# Patient Record
Sex: Male | Born: 1970 | Hispanic: Yes | Marital: Single | Smoking: Never smoker
Health system: Southern US, Community
[De-identification: ages and names within clinical notes are randomized; demographics above are authoritative.]

## PROBLEM LIST (undated history)

## (undated) DIAGNOSIS — I471 Supraventricular tachycardia, unspecified: Secondary | ICD-10-CM

## (undated) HISTORY — PX: ABLATION: SHX5711

## (undated) HISTORY — PX: CHOLECYSTECTOMY: SHX55

---

## 2005-07-10 ENCOUNTER — Other Ambulatory Visit: Payer: Self-pay

## 2005-07-10 ENCOUNTER — Emergency Department: Payer: Self-pay | Admitting: General Practice

## 2005-07-11 ENCOUNTER — Ambulatory Visit: Payer: Self-pay | Admitting: General Practice

## 2005-07-25 ENCOUNTER — Ambulatory Visit: Payer: Self-pay | Admitting: Family Medicine

## 2006-07-12 ENCOUNTER — Other Ambulatory Visit: Payer: Self-pay

## 2006-07-12 ENCOUNTER — Emergency Department: Payer: Self-pay | Admitting: Emergency Medicine

## 2006-08-27 ENCOUNTER — Ambulatory Visit: Payer: Self-pay | Admitting: Cardiology

## 2007-04-22 ENCOUNTER — Ambulatory Visit: Payer: Self-pay | Admitting: Cardiology

## 2007-07-15 ENCOUNTER — Ambulatory Visit: Payer: Self-pay | Admitting: General Practice

## 2007-08-12 ENCOUNTER — Ambulatory Visit: Payer: Self-pay | Admitting: General Practice

## 2008-07-12 ENCOUNTER — Emergency Department: Payer: Self-pay | Admitting: Emergency Medicine

## 2008-07-13 ENCOUNTER — Emergency Department: Payer: Self-pay | Admitting: Emergency Medicine

## 2008-07-18 ENCOUNTER — Ambulatory Visit: Payer: Self-pay | Admitting: Family Medicine

## 2008-09-28 ENCOUNTER — Ambulatory Visit: Payer: Self-pay

## 2008-10-06 ENCOUNTER — Ambulatory Visit: Payer: Self-pay | Admitting: Unknown Physician Specialty

## 2008-10-11 ENCOUNTER — Ambulatory Visit: Payer: Self-pay | Admitting: Unknown Physician Specialty

## 2009-01-18 ENCOUNTER — Other Ambulatory Visit: Payer: Self-pay

## 2010-04-06 ENCOUNTER — Ambulatory Visit: Payer: Self-pay | Admitting: General Practice

## 2013-01-26 ENCOUNTER — Observation Stay: Payer: Self-pay | Admitting: Surgery

## 2013-01-26 ENCOUNTER — Ambulatory Visit: Payer: Self-pay | Admitting: Family Medicine

## 2013-01-26 LAB — COMPREHENSIVE METABOLIC PANEL
Albumin: 3.8 g/dL (ref 3.4–5.0)
Alkaline Phosphatase: 98 U/L (ref 50–136)
Anion Gap: 8 (ref 7–16)
BUN: 4 mg/dL — ABNORMAL LOW (ref 7–18)
Bilirubin,Total: 0.8 mg/dL (ref 0.2–1.0)
Calcium, Total: 9.4 mg/dL (ref 8.5–10.1)
Chloride: 100 mmol/L (ref 98–107)
Co2: 28 mmol/L (ref 21–32)
Creatinine: 0.84 mg/dL (ref 0.60–1.30)
EGFR (African American): >60
EGFR (Non-African Amer.): >60
Glucose: 94 mg/dL (ref 65–99)
Osmolality: 269 (ref 275–301)
Potassium: 3.9 mmol/L (ref 3.5–5.1)
SGOT(AST): 22 U/L (ref 15–37)
SGPT (ALT): 30 U/L (ref 12–78)
Sodium: 136 mmol/L (ref 136–145)
Total Protein: 7.8 g/dL (ref 6.4–8.2)

## 2013-01-26 LAB — CBC
HCT: 40.1 % (ref 40.0–52.0)
HGB: 14 g/dL (ref 13.0–18.0)
MCH: 32.5 pg (ref 26.0–34.0)
MCHC: 34.9 g/dL (ref 32.0–36.0)
MCV: 93 fL (ref 80–100)
Platelet: 217 10*3/uL (ref 150–440)
RBC: 4.31 10*6/uL — ABNORMAL LOW (ref 4.40–5.90)
RDW: 13.3 % (ref 11.5–14.5)
WBC: 10 10*3/uL (ref 3.8–10.6)

## 2013-01-26 LAB — LIPASE, BLOOD: Lipase: 81 U/L (ref 73–393)

## 2013-01-26 LAB — PROTIME-INR
INR: 1
Prothrombin Time: 13.5 secs (ref 11.5–14.7)

## 2013-01-28 LAB — PATHOLOGY REPORT

## 2013-02-04 ENCOUNTER — Ambulatory Visit: Payer: Self-pay | Admitting: Gastroenterology

## 2013-02-12 ENCOUNTER — Ambulatory Visit: Payer: Self-pay | Admitting: Gastroenterology

## 2013-03-23 ENCOUNTER — Ambulatory Visit: Payer: Self-pay | Admitting: Gastroenterology

## 2013-10-12 DIAGNOSIS — I471 Supraventricular tachycardia: Secondary | ICD-10-CM | POA: Insufficient documentation

## 2014-09-02 NOTE — Consult Note (Signed)
Surgical path reviewed. No obvious Crohn's at least on path. If clinically patient does not recover quickly, can order SB series to evaluate TI and cecal area. If patient recovers quickly, then patient can f/u in office after discharge. We can then arrange outpt colonoscopy in several weeks. Thanks  Electronic Signatures: Lutricia Feilh, Brenisha Tsui (MD)  (Signed on 18-Sep-14 13:19)  Authored  Last Updated: 18-Sep-14 13:19 by Lutricia Feilh, Eyan Hagood (MD)

## 2014-09-02 NOTE — Consult Note (Signed)
Brief Consult Note: Diagnosis: Acute appendicitis.  s/p appendectomy.  Abnormal findings on CT scan of abdomen and pelvis.  Inflammatory changes involving cecum.  Concerning for Crohn's disease.   Consult note dictated.   Discussed with Attending MD.   Comments: Patient's presentation discussed with Dr. Lutricia FeilPaul Oh.  Recommendation is to proceed forward with diagnostic colonoscopy to allow direct luminal evaluation of colon and obtain biopsies.  Date to be decided based on recovery period from recent appendectomy.  Recommendation to be performed on outpatient basis at this time. Will continue to monitor.  Electronic Signatures: Rodman KeyHarrison, Dawn S (NP)  (Signed 17-Sep-14 15:28)  Authored: Brief Consult Note   Last Updated: 17-Sep-14 15:28 by Rodman KeyHarrison, Dawn S (NP)

## 2014-09-02 NOTE — Consult Note (Signed)
Pt seen and examined. Please see Dawn Harrison's notes. Await surg path. If path c/w IBD, then will start 5-ASA product. Otherwise, will need outpt colonoscopy later. Will follow. Thanks  Electronic Signatures: Lutricia Feilh, Ayaana Biondo (MD)  (Signed on 17-Sep-14 19:13)  Authored  Last Updated: 17-Sep-14 19:13 by Lutricia Feilh, Kenniyah Sasaki (MD)

## 2014-09-02 NOTE — Consult Note (Signed)
PATIENT NAME:  Tracy Herring, Tracy Herring MR#:  [id number] DATE OF BIRTH:  1970/07/29  DATE OF CONSULTATION:  01/27/2013  ATTENDING PHYSICIAN:  Dr. Marshia Ly CONSULTING PHYSICIAN:  Lutricia Feil, MD/Joanthony Hamza Mort Sawyers, NP PRIMARY CARE PHYSICIAN: At [facility]  REASON FOR CONSULT: CT scan concerning for Crohn's disease.   HISTORY OF PRESENT ILLNESS: Tracy Herring is a 44 year old Hispanic gentleman who presented to [facility] Emergency Room on 01/26/2013 with a 4-day history of generalized abdominal pain, she states on last Friday that he started to experience generalized abdominal pain with increase in abdominal girth size, bloating and intestinal gas. He has a known history of having had Helicobacter pylori treated by Dr. Lynnae Prude at the time of EGD revealing this by biopsy approximately 3 to 4 years ago. He does note, though, that he was in [place] for 2 months in length, just came home 2 months ago, and during that timeframe he actually experienced diarrhea twice 4 days in length both times. He did not seek medical attention at that time. The patient states that over the weekend he had 1 episode of severe diarrhea which occurred approximately 48 hours prior to presenting to the Emergency Room. He denies any nausea. No vomiting. No rectal bleeding or melena. He was found to have a white count over 10,000. CT scan of abdomen and pelvis was performed which revealed evidence of a thickened appendix with some mild periappendiceal inflammatory changes and thickened cecum.  It was read as acute appendicitis and possible cecitis.   He states a long history of abdominal bloating, distention. He states that he has these episodes which occur on average once a month. He has changed his diet recently, eating more fruits vegetables. His bowels are moving on average 3 times a day. He does notice at times evidence of bright red blood on toilet paper which he feels is in correlation with hemorrhoids. This has  been going on for the past 5 years. No fevers. Known history of reflux which is well controlled, no dysphagia or odynophagia.  Prior to presenting, though, he did feel that he needed to be "cleansed" and drank prune juice with MiraLax which did result in good results on Monday.   HOME MEDICATIONS: None.   ALLERGIES: None.   PAST MEDICAL HISTORY: Tachycardia, status post cardiac ablation, reflux.   PAST SURGICAL HISTORY: Cardiac ablation and then appendectomy 01/26/2013.   FAMILY HISTORY: Great uncle maternal side of the family history of brain cancer, and maternal grandmother,  "intestinal cancer," felt to be probably colon.  Mother, history of colitis, questionable ulcerative colitis. No documented family history of Crohn's disease or celiac.   SOCIAL HISTORY: No tobacco, no alcohol use. 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   REVIEW OF SYSTEMS: All 10 systems reviewed and checked, otherwise unremarkable other than what is stated above.   PHYSICAL EXAMINATION: VITAL SIGNS: Temperature is 98.6, pulse is 72, respirations are 18, blood pressure is 105/66, and pulse ox is 96% on room air.  GENERAL: Well developed, well nourished 44 year old Hispanic gentleman, no acute distress noted which is not unreasonable status post surgery, some grimacing noted with movement.  HEENT: Normocephalic, atraumatic. Pupils equal, reactive to light. Conjunctivae clear. Sclerae anicteric.  NECK: Supple. Trachea midline. No lymphadenopathy or thyromegaly.  PULMONARY: Symmetric rise and fall of chest. Clear to auscultation throughout.  CARDIOVASCULAR: Regular rhythm, S1, S2. No murmurs, no gallops.  ABDOMEN: Slightly distended. Bowel sounds  are hypoactive. No masses. No bruits.  RECTAL: Deferred.  MUSCULOSKELETAL: Moving all 4 extremities. No contractures. No clubbing.  NEUROLOGICAL: No gross neurological deficits.  PSYCHIATRIC: Alert  and oriented x 4. Memory grossly intact. Appropriate affect and mood.   LABORATORY AND DIAGNOSTIC: Chemistry panel within normal limits except BUN is low at 4. Hepatic panel within normal limits. CBC: RBC is 4.31, otherwise within normal limits. Antibody screen is negative. ABO group plus Rh type is A+. PT is 13.1 and INR is 1.0. EKG is normal sinus rhythm. CT scan abdomen and pelvis with contrast: Again, findings consistent with acute appendicitis and an appearance of cecitis. Otherwise unremarkable.   IMPRESSION:  1.  Acute appendicitis, status post appendectomy.  2.  Findings  of inflammatory changes involving the cecum on CT scan of abdomen and pelvis. History of generalized abdominal discomfort intermittently. Findings concerning for possible Crohn's disease.  PLAN: The patient's presentation was discussed with Dr. Lutricia FeilPaul Oh. Do recommend endoscopic evaluation via diagnostic colonoscopy. Date to be decided depending on the patient's recovery from recent appendectomy. This was discussed with the patient. The patient voiced understanding and agreement. Feel will need to be done on an outpatient basis. We will continue to monitor the patient during hospitalization at this time.  No further GI recommendations. I will monitor laboratory studies as well.   These services provided by Rodman Keyawn S. Samanthan Dugo, MS, APRN, [facility], FNP,  under collaborative agreement with Lutricia FeilPaul Oh, MD.       ____________________________ Rodman Keyawn S. Quy Lotts, NP dsh:cb D: 01/27/2013 15:26:21 ET T: 01/27/2013 16:38:58 ET JOB#: [id number]  cc: Rodman Keyawn S. Mikhael Hendriks, NP, <Dictator> Rodman KeyAWN S Janus Vlcek MD ELECTRONICALLY SIGNED 01/27/2013 18:23

## 2014-09-02 NOTE — Discharge Summary (Signed)
PATIENT NAME:  Tracy Herring, Tracy Herring MR#:  [id number] DATE OF BIRTH:  1971-04-03  DATE OF ADMISSION:  01/26/2013 DATE OF DISCHARGE:  01/28/2013.   BRIEF HISTORY: Tracy Herring is a 44 year old gentleman admitted through the Emergency Room with signs and symptoms consistent with acute appendicitis. CT scan, however, revealed some cecal thickening in addition which was concerning for inflammatory bowel disease or cecitis. Because of his inflamed appendix, he was taken urgently to surgery that evening where he underwent a laparoscopic appendectomy. His appendix was injected and swollen with an obvious fecalith at the base. Surgery was accomplished without difficulty. The cecum did appear to be quite firm without obvious evidence of Crohn's disease. He has done well over the course of the last 24 hours. He was able to tolerate a regular diet. He has been seen by the GI service who recommended followup with colonoscopy in several weeks. He is discharged home tonight to be followed in our office in 7 to 10 days' time.   He is discharged home on Vicodin for pain. He is also discharged home on metronidazole 500 mg every 8 hours for 7 days and ciprofloxacin 500 mg every 12 hours for 7 days.   FINAL DISCHARGE DIAGNOSIS: Acute appendicitis, possible inflammatory bowel disease.   SURGERY: Laparoscopic appendectomy.   ____________________________ Quentin Orealph L. Ely III, MD rle:gb D: 01/28/2013 20:36:14 ET T: 01/28/2013 20:45:29 ET JOB#: [id number]  cc: Carmie Endalph L. Ely III, MD, <Dictator> Rhona LeavensJames F. Burnett ShengHedrick, MD Ezzard StandingPaul Y. Bluford Kaufmannh, MD Quentin OreALPH L ELY MD ELECTRONICALLY SIGNED 02/01/2013 20:04

## 2014-09-02 NOTE — Op Note (Signed)
PATIENT NAME:  Tracy Herring, Anvay MR#:  [id number] DATE OF BIRTH:  1971-04-27  DATE OF PROCEDURE:  01/26/2013  PREOPERATIVE DIAGNOSIS:  Acute appendicitis.   POSTOPERATIVE DIAGNOSIS:  Acute appendicitis, possible Crohn's disease.   SURGERY:  Laparoscopic appendectomy.   SURGEON:  Quentin Orealph L. Ely, M.D.   ANESTHESIA:  General.   OPERATIVE PROCEDURE:  With the patient in the supine position after induction of appropriate general anesthesia, the patient's abdomen was prepped with ChloraPrep and draped with sterile towels.  The patient placed in head down, feet up position.  A small infraumbilical incision was made in the standard fashion, carried down bluntly through the subcutaneous tissue.  A Veress needle was used to cannulate the peritoneal cavity.  CO2 was insufflated to appropriate pressure measurements.  When approximately two and a half liters of CO2 were instilled, the Veress needle was withdrawn.  An 11 mm [employer] port was inserted into the peritoneal cavity and appropriate position was confirmed.  CO2 was reinsufflated.  The right lower quadrant was investigated.  The appendix was easily visualized, appeared to be thickened, injected and inflamed.  However, there was not any evidence of any surrounding inflammatory change.  A midepigastric transverse incision was made and an 11 mm port was inserted under direct vision.  The camera was moved to the upper port.  Suprapubic port was placed, 12 mm in size under direct vision.  Dissection was carried at the base of the appendix.  The mesoappendix was foreshortened, but there did not appear to be any significant inflammatory change.  The cecum itself was quite thickened, but there was no creeping fat and no obvious cirrhosal change and no obvious inflammation.  There did appear to be a mass effect or thickened cecal wall, the etiology of which cannot be determined by laparoscopy.  I elected to perform an appendectomy as the patient's  appendix did not appear to be significantly involved.  My feeling with the clinical presentation and the visual inspection of the right lower quadrant is that of Crohn's disease rather than malignancy.  The mesoappendix was divided with a single application of the Endo GIA stapler carrying a white load and the base of the appendix was amputated at the cecum using a single application of Endo GIA stapler carrying a blue load.  The appendix was captured in an Endo Catch apparatus and removed through the suprapubic incision.  The area was copiously irrigated with warm saline solution for 2 liters.  The abdomen was then desufflated.  The lower midline and umbilical fascia were closed with 0 Vicryl suture in a figure-of-eight fashion.  The suprapubic incision was closed with the suture passer under direct vision.  Skin incisions were closed with 5-0 nylon.  The area infiltrated with 0.25% Marcaine for postoperative pain control.  Sterile dressings were applied.  The patient was awakened and returned to the recovery room in the company of the anesthesiologist.  Sponge, instrument and needle counts were correct x 2 in the operating room.    ____________________________ Carmie Endalph L. Ely III, MD rle:ea D: 01/26/2013 22:11:47 ET T: 01/27/2013 00:17:39 ET JOB#: [id number]  cc: Carmie Endalph L. Ely III, MD, <Dictator> Rhona LeavensJames F. Burnett ShengHedrick, MD Scot Junobert T. Elliott, MD Quentin OreALPH L ELY MD ELECTRONICALLY SIGNED 01/28/2013 0:06

## 2014-09-02 NOTE — H&P (Signed)
PATIENT NAME:  Tracy Herring, Tracy Herring MR#:  [id number] DATE OF BIRTH:  February 04, 1971  DATE OF ADMISSION:  01/26/2013  PRIMARY CARE PHYSICIAN:  [facility].   ADMITTING PHYSICIAN:  Dr. Michela Pitcher.   CHIEF COMPLAINT:  Abdominal pain.   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 He was in his usual state of good health until Friday September 12th when he began to develop some generalized abdominal discomfort. He has a history of GI distress, treated for increased problems with abdominal bloating and gas. Has had an upper endoscopy which did reveal some changes consistent with GERD but has never had a colonoscopy.  He thought the symptoms he experienced on the 12th were part of his normal problem. However, he began to become progressively uncomfortable over the weekend, had an episode of severe diarrhea 48 hours ago. With continued symptoms, he had some mild nausea, presented to his primary care physician. He had elevated white blood cell count just over 10,000. CT scan was performed with contrast which demonstrated a thickened appendix with some mild periappendiceal inflammatory changes and a thickened cecum read as acute appendicitis, possible cecitis.  The patient was referred to the Emergency Room and surgical service was consulted.   He never had a previous colonoscopy. He does not have any significant history of diarrhea but has had abdominal discomfort intermittently for years. He has not lost weight recently, although he has maintained a low BMI for a number of years. He has had no previous abdominal surgery. He has had a previous problem with tachycardia treated by Dr. Mariel Kansky, referred to [facility] for an ablation, which was performed 6 years ago. He is currently on no medication for that. He denies any hypertension, diabetic problems or thyroid  disease. He takes no other medications regularly. He is not allergic to any medications.   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   REVIEW OF SYSTEMS: Otherwise unremarkable. He has no urinary symptoms at the present time.   FAMILY HISTORY: Noncontributory.   PHYSICAL EXAMINATION: GENERAL: He seems comfortable at rest, although he has recently been medicated.  VITALS: Blood pressure 128/72. Temperature is 99. Pulse rate is 90 and regular. His pain scale is an 8.  HEENT:  Reveals no scleral icterus. No pupillary abnormalities. No facial deformities.  NECK: Supple, nontender with midline trachea. No adenopathy.  CHEST: Clear with no adventitious sounds. He has normal pulmonary excursion.  CARDIAC: Reveals no murmurs or gallops to my ear. He seems to be in normal sinus rhythm.  ABDOMEN: Soft. He does have marked point tenderness in the right lower quadrant suprapubic area with mild guarding. He has no rebound noted. He is hypoactive, but present bowel sounds.  EXTREMITIES: With full range of motion, no deformities.  PSYCHIATRIC: Normal orientation, normal affect.   IMPRESSION: I have independently reviewed his CT scan, as well as the CT scan report. I am a bit concerned about the degree of inflammatory change in the cecum which raises the question of possible Crohn disease as opposed to simple appendicitis. However, we could definitely have appendicitis with surrounding inflammatory changes involving the cecum. I think in view of his current symptoms and the CT findings, we should consider laparoscopy, possible appendectomy, possible laparotomy, possible colon resection based on the findings at surgery. This  plan has been outlined to him in detail, and he is in agreement.     ____________________________ Carmie Endalph L. Ely III, MD rle:dmm D: 01/26/2013 19:58:00 ET T: 01/26/2013 20:23:53 ET JOB#: [id number]  cc: Carmie Endalph L. Ely  III, MD, <Dictator> Rhona LeavensJames F. Burnett ShengHedrick, MD Quentin OreALPH L ELY MD ELECTRONICALLY SIGNED 01/28/2013 0:05

## 2014-10-06 IMAGING — CT CT ABD-PELV W/ CM
1 of 2 series · 15 of 32 positions shown, 19 images · IV contrast (isovue)
Comparison: None

REASON FOR EXAM: Call report [id number]    RLQ pain
COMMENTS:

PROCEDURE:     CT  - CT ABDOMEN / PELVIS  W  - January 26, 2013  [DATE]
RESULT:     History: Right lower quadrant pain
TECHNIQUE: Multiple axial images of the abdomen and pelvis were performed
from the lung bases to the pubic symphysis, with p.o. contrast and with 100
ml of Isovue 370 intravenous contrast.

[Series 2: soft tissue · axial · 0.66mm/px · z∈[-904,-475]mm · 15 of 157 slices shown, 19 images]
[im 7/157  soft-tissue]
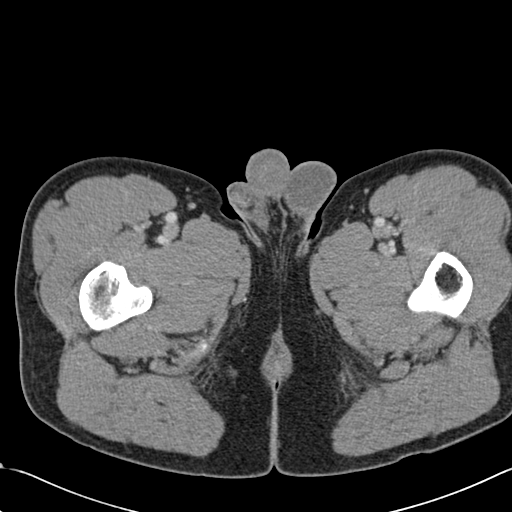
[im 7/157  bone]
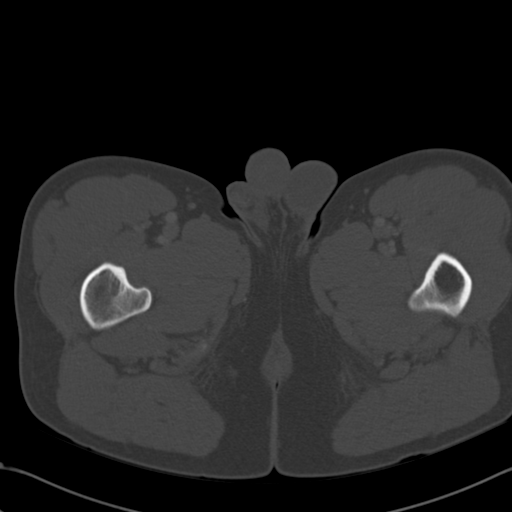
[im 20/157  soft-tissue]
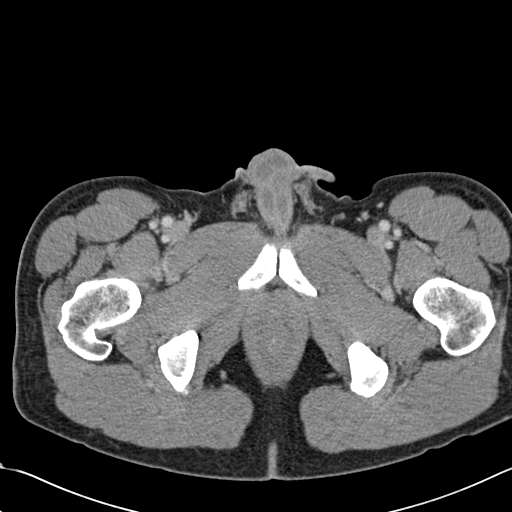
[im 33/157  soft-tissue]
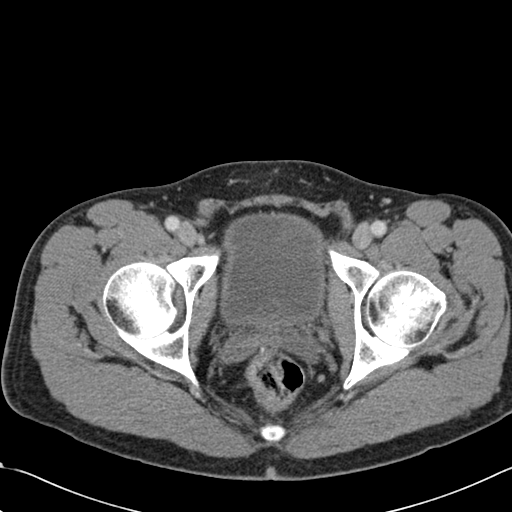
[im 46/157  soft-tissue]
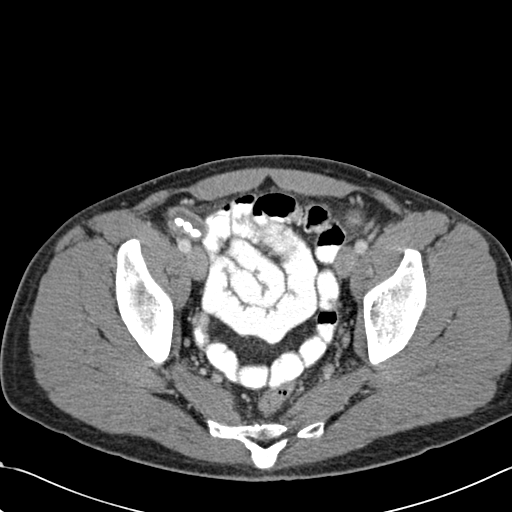
[im 53/157  soft-tissue]
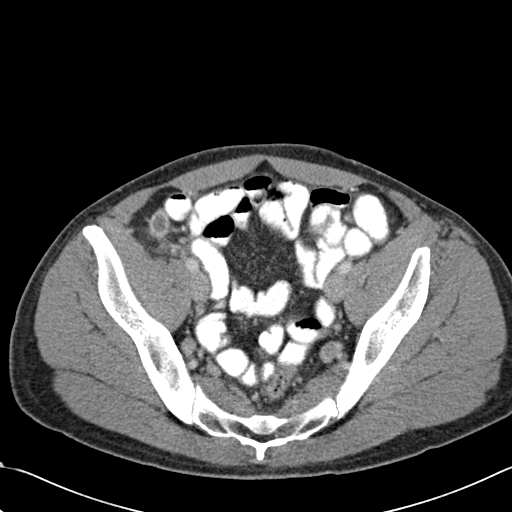
[im 66/157  soft-tissue]
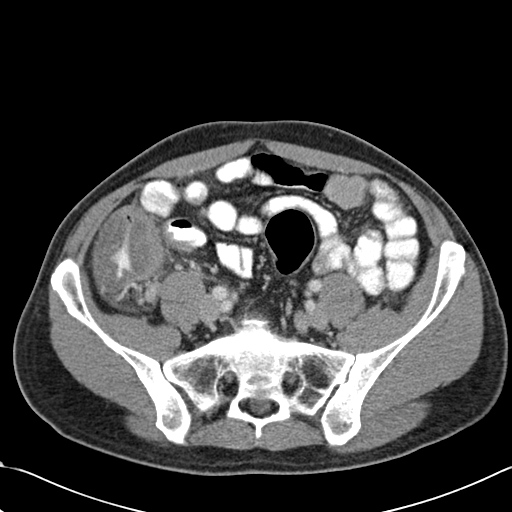
[im 79/157  soft-tissue]
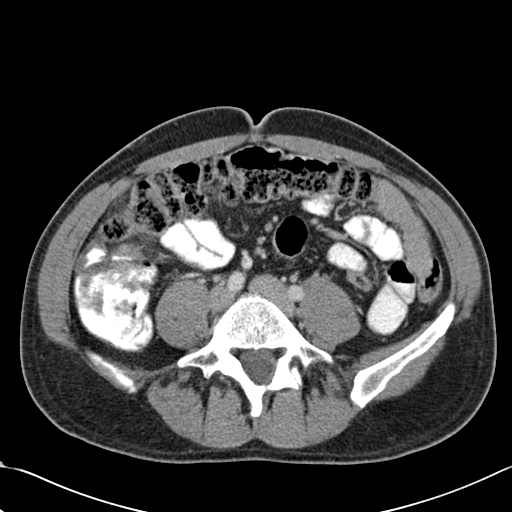
[im 92/157  soft-tissue]
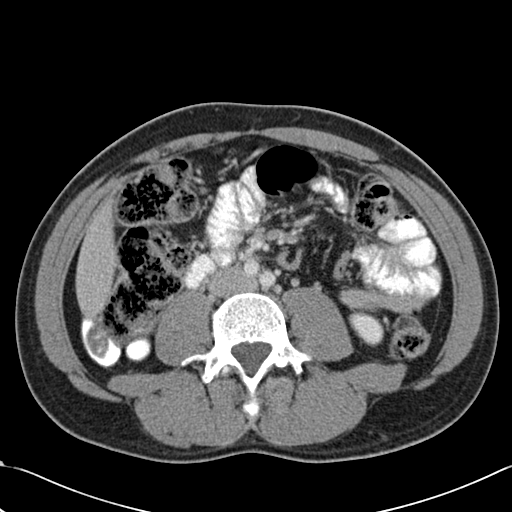
[im 105/157  soft-tissue]
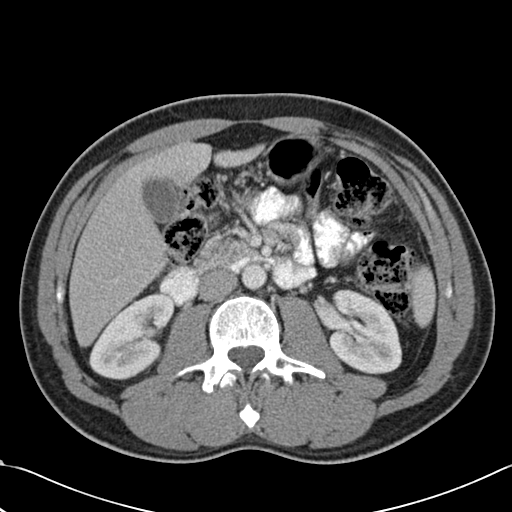
[im 105/157  bone]
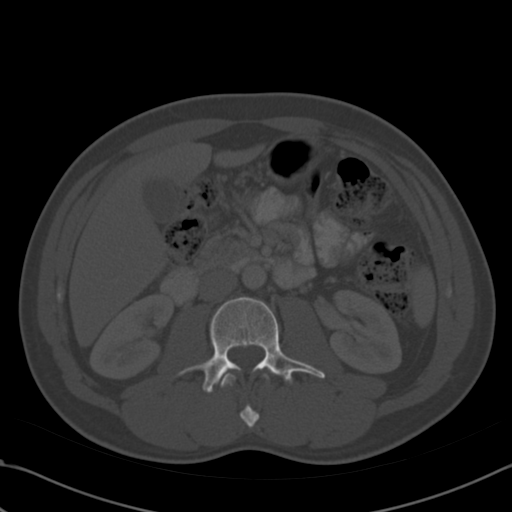
[im 111/157  soft-tissue]
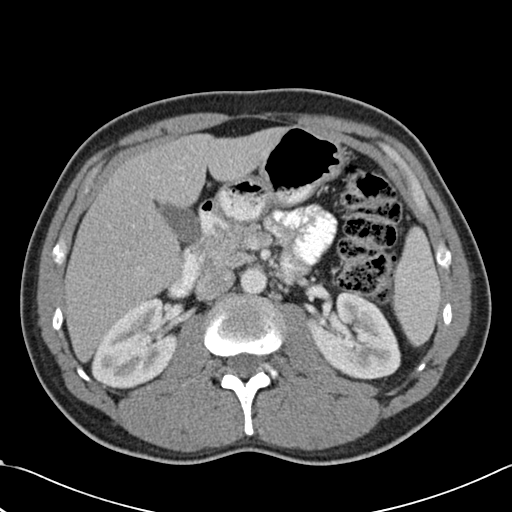
[im 124/157  soft-tissue]
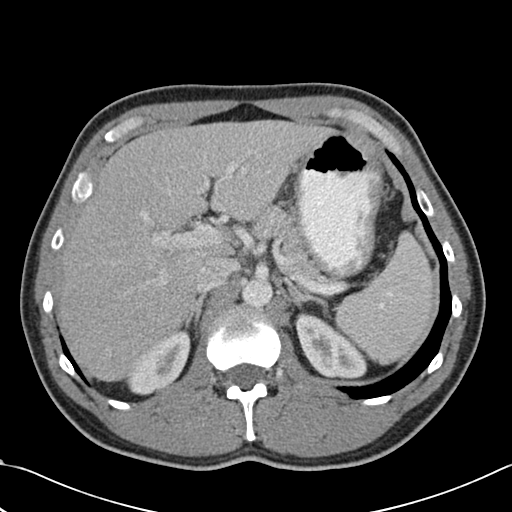
[im 131/157  lung]
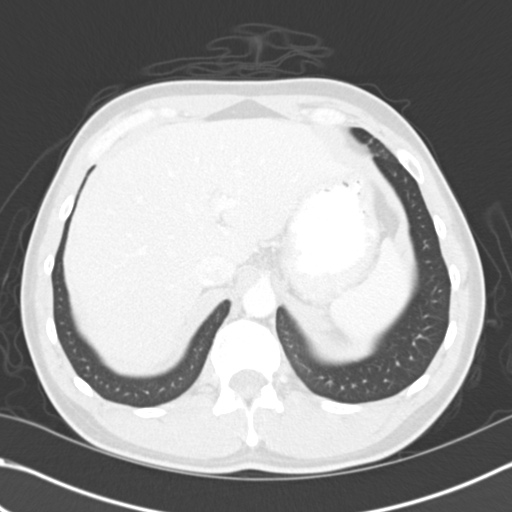
[im 137/157  soft-tissue]
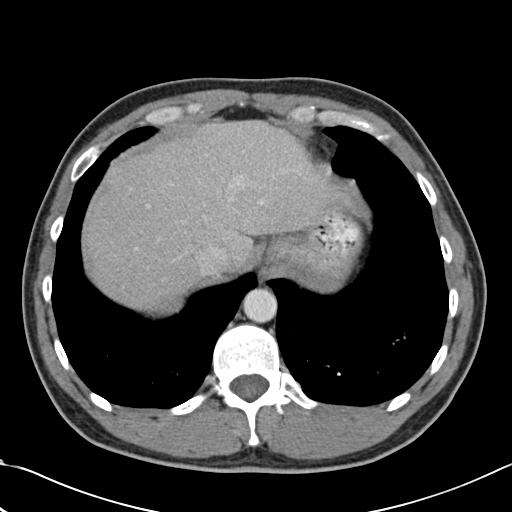
[im 137/157  lung]
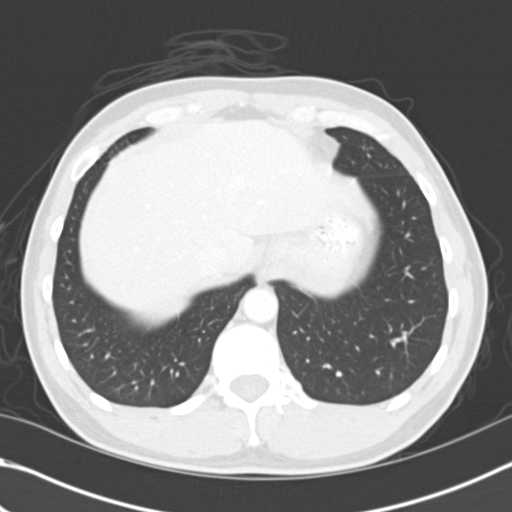
[im 144/157  lung]
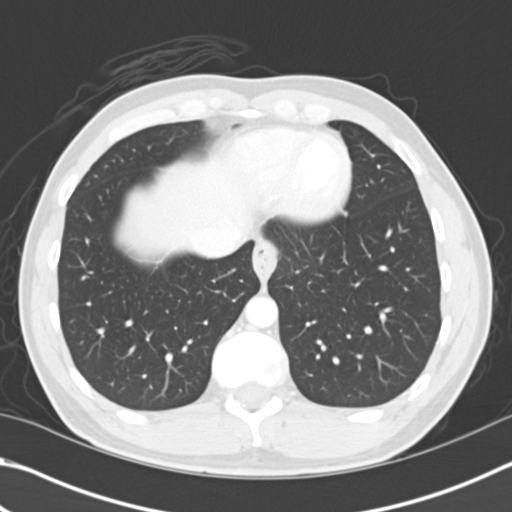
[im 150/157  soft-tissue]
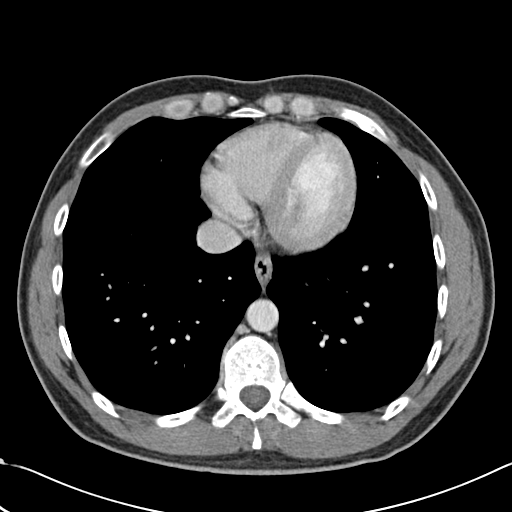
[im 150/157  lung]
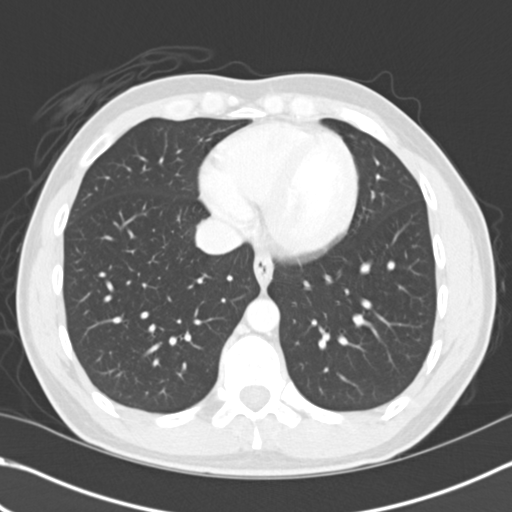

[15 of 32 positions shown; findings below may reference images not displayed]

FINDINGS: The lung bases are clear. There is no pneumothorax. The heart size is
normal.

The liver demonstrates no focal abnormality. There is no intrahepatic or
extrahepatic biliary ductal dilatation. The gallbladder is unremarkable. The
spleen demonstrates no focal abnormality. The kidneys, adrenal glands, and
pancreas are normal. The bladder is unremarkable.

The appendix is dilated measuring 14 mm with multiple appendicoliths present
and cecal bowel wall thickening. There is periappendiceal inflammatory
change. There is no focal fluid collection. The appearance is most
consistent with appendicitis and cecitis. There is no pneumoperitoneum,
pneumatosis, or portal venous gas. There is no abdominal or pelvic free
fluid. There is no lymphadenopathy.

The abdominal aorta is normal in caliber .

The osseous structures are unremarkable.
IMPRESSION: 1. Findings most consistent with acute appendicitis.

[REDACTED]

## 2015-07-28 ENCOUNTER — Ambulatory Visit: Payer: Self-pay | Admitting: Physician Assistant

## 2015-07-28 ENCOUNTER — Encounter: Payer: Self-pay | Admitting: Physician Assistant

## 2015-07-28 DIAGNOSIS — F4541 Pain disorder exclusively related to psychological factors: Secondary | ICD-10-CM

## 2015-07-28 NOTE — Progress Notes (Signed)
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

## 2015-09-18 ENCOUNTER — Ambulatory Visit: Payer: Self-pay | Admitting: Physician Assistant

## 2015-09-18 VITALS — BP 126/80 | HR 76 | Resp 16

## 2015-09-18 DIAGNOSIS — S29011A Strain of muscle and tendon of front wall of thorax, initial encounter: Secondary | ICD-10-CM

## 2015-09-19 ENCOUNTER — Encounter: Payer: Self-pay | Admitting: Physician Assistant

## 2015-09-19 NOTE — Progress Notes (Signed)
S/ reports chronic difficulty straining with defecation although stools are "soft"  , he felt a pulling sensation in lower sternal area one week ago with defecation strain , he is concerned about having a hernia . He has not taking any thing or used heat or ice. He denies wt loss, Pleuritic pain, SOB, anorexia,abd pain,nausea or vomiting, melena or BRB  O/ VSS NAD anxious Heart RSR without abn sounds Lungs clear Chest Wall nontender he points to lower sternal area xyphoid that was hurting , not reproducable now. Abd soft without megally, mass or tenderness , rebound with flexion of neck he has  I FB separation of rectus abdominal muscles without  herniation ,old  incisional scar above the umbelicus , intraumbelical from appendectomy  A/ chest wall pain  Resolved Constipation  P /advised to take otc aleve bid with meals x one week. Avoid abdominals at gym x until sx s abate. May use topical heat . RTC if sxs not improving. Constipation addressed  .

## 2015-09-25 ENCOUNTER — Ambulatory Visit
Admission: RE | Admit: 2015-09-25 | Discharge: 2015-09-25 | Disposition: A | Discharge status: Home / Self Care | Payer: BC Managed Care – PPO | Source: Ambulatory Visit | Attending: Physician Assistant | Admitting: Physician Assistant

## 2015-09-25 ENCOUNTER — Encounter: Payer: Self-pay | Admitting: Physician Assistant

## 2015-09-25 ENCOUNTER — Ambulatory Visit: Payer: Self-pay | Admitting: Physician Assistant

## 2015-09-25 VITALS — BP 110/70 | HR 68 | Temp 97.7°F

## 2015-09-25 DIAGNOSIS — R1010 Upper abdominal pain, unspecified: Secondary | ICD-10-CM | POA: Diagnosis present

## 2015-09-25 MED ORDER — OMEPRAZOLE 20 MG PO CPDR: 20.0000 mg | DELAYED_RELEASE_CAPSULE | Freq: Every day | ORAL | Status: DC

## 2015-09-25 NOTE — Patient Instructions (Signed)
Ventral Hernia A ventral hernia (also called an incisional hernia) is a hernia that occurs at the site of a previous surgical cut (incision) in the abdomen. The abdominal wall spans from your lower chest down to your pelvis. If the abdominal wall is weakened from a surgical incision, a hernia can occur. A hernia is a bulge of bowel or muscle tissue pushing out on the weakened part of the abdominal wall. Ventral hernias can get bigger from straining or lifting. Obese and older people are at higher risk for a ventral hernia. People who develop infections after surgery or require repeat incisions at the same site on the abdomen are also at increased risk. CAUSES  A ventral hernia occurs because of weakness in the abdominal wall at an incision site.  SYMPTOMS  Common symptoms include:  A visible bulge or lump on the abdominal wall.  Pain or tenderness around the lump.  Increased discomfort if you cough or make a sudden movement. If the hernia has blocked part of the intestine, a serious complication can occur (incarcerated or strangulated hernia). This can become a problem that requires emergency surgery because the blood flow to the blocked intestine may be cut off. Symptoms may include:  Feeling sick to your stomach (nauseous).  Throwing up (vomiting).  Stomach swelling (distention) or bloating.  Fever.  Rapid heartbeat. DIAGNOSIS  Your health care provider will take a medical history and perform a physical exam. Various tests may be ordered, such as:  Blood tests.  Urine tests.  Ultrasonography.  X-rays.  Computed tomography (CT). TREATMENT  Watchful waiting may be all that is needed for a smaller hernia that does not cause symptoms. Your health care provider may recommend the use of a supportive belt (truss) that helps to keep the abdominal wall intact. For larger hernias or those that cause pain, surgery to repair the hernia is usually recommended. If a hernia becomes  strangulated, emergency surgery needs to be done right away. HOME CARE INSTRUCTIONS  Avoid putting pressure or strain on the abdominal area.  Avoid heavy lifting.  Use good body positioning for physical tasks. Ask your health care provider about proper body positioning.  Use a supportive belt as directed by your health care provider.  Maintain a healthy weight.  Eat foods that are high in fiber, such as whole grains, fruits, and vegetables. Fiber helps prevent difficult bowel movements (constipation).  Drink enough fluids to keep your urine clear or pale yellow.  Follow up with your health care provider as directed. SEEK MEDICAL CARE IF:   Your hernia seems to be getting larger or more painful. SEEK IMMEDIATE MEDICAL CARE IF:   You have abdominal pain that is sudden and sharp.  Your pain becomes severe.  You have repeated vomiting.  You are sweating a lot.  You notice a rapid heartbeat.  You develop a fever. MAKE SURE YOU:   Understand these instructions.  Will watch your condition.  Will get help right away if you are not doing well or get worse.   This information is not intended to replace advice given to you by your health care provider. Make sure you discuss any questions you have with your health care provider.   Document Released: 04/15/2012 Document Revised: 05/20/2014 Document Reviewed: 04/15/2012 Elsevier Interactive Patient Education 2016 [employer] Hiatal Hernia A hiatal hernia occurs when part of your stomach slides above the muscle that separates your abdomen from your chest (diaphragm). You can be born with a hiatal hernia (  congenital), or it may develop over time. In almost all cases of hiatal hernia, only the top part of the stomach pushes through.  Many people have a hiatal hernia with no symptoms. The larger the hernia, the more likely that you will have symptoms. In some cases, a hiatal hernia allows stomach acid to flow back into the tube that  carries food from your mouth to your stomach (esophagus). This may cause heartburn symptoms. Severe heartburn symptoms may mean you have developed a condition called gastroesophageal reflux disease (GERD).  CAUSES  Hiatal hernias are caused by a weakness in the opening (hiatus) where your esophagus passes through your diaphragm to attach to the upper part of your stomach. You may be born with a weakness in your hiatus, or a weakness can develop. RISK FACTORS Older age is a major risk factor for a hiatal hernia. Anything that increases pressure on your diaphragm can also increase your risk of a hiatal hernia. This includes:  Pregnancy.  Excess weight.  Frequent constipation. SIGNS AND SYMPTOMS  People with a hiatal hernia often have no symptoms. If symptoms develop, they are almost always caused by GERD. They may include:  Heartburn.  Belching.  Indigestion.  Trouble swallowing.  Coughing or wheezing.  Sore throat.  Hoarseness.  Chest pain. DIAGNOSIS  A hiatal hernia is sometimes found during an exam for another problem. Your health care provider may suspect a hiatal hernia if you have symptoms of GERD. Tests may be done to diagnose GERD. These may include:  X-rays of your stomach or chest.  An upper gastrointestinal (GI) series. This is an X-ray exam of your GI tract involving the use of a chalky liquid that you swallow. The liquid shows up clearly on the X-ray.  Endoscopy. This is a procedure to look into your stomach using a thin, flexible tube that has a tiny camera and light on the end of it. TREATMENT  If you have no symptoms, you may not need treatment. If you have symptoms, treatment may include:  Dietary and lifestyle changes to help reduce GERD symptoms.  Medicines. These may include:  Over-the-counter antacids.  Medicines that make your stomach empty more quickly.  Medicines that block the production of stomach acid (H2 blockers).  Stronger medicines to  reduce stomach acid (proton pump inhibitors).  You may need surgery to repair the hernia if other treatments are not helping. HOME CARE INSTRUCTIONS   Take all medicines as directed by your health care provider.  Quit smoking, if you smoke.  Try to achieve and maintain a healthy body weight.  Eat frequent small meals instead of three large meals a day. This keeps your stomach from getting too full.  Eat slowly.  Do not lie down right after eating.  Do noteat 1-2 hours before bed.   Do not drink beverages with caffeine. These include cola, coffee, cocoa, and tea.  Do not drink alcohol.  Avoid foods that can make symptoms of GERD worse. These may include:  Fatty foods.  Citrus fruits.  Other foods and drinks that contain acid.  Avoid putting pressure on your belly. Anything that puts pressure on your belly increases the amount of acid that may be pushed up into your esophagus.   Avoid bending over, especially after eating.  Raise the head of your bed by putting blocks under the legs. This keeps your head and esophagus higher than your stomach.  Do not wear tight clothing around your chest or stomach.  Try not  to strain when having a bowel movement, when urinating, or when lifting heavy objects. SEEK MEDICAL CARE IF:  Your symptoms are not controlled with medicines or lifestyle changes.  You are having trouble swallowing.  You have coughing or wheezing that will not go away. SEEK IMMEDIATE MEDICAL CARE IF:  Your pain is getting worse.  Your pain spreads to your arms, neck, jaw, teeth, or back.  You have shortness of breath.  You sweat for no reason.  You feel sick to your stomach (nauseous) or vomit.  You vomit blood.  You have bright red blood in your stools.  You have black, tarry stools.    This information is not intended to replace advice given to you by your health care provider. Make sure you discuss any questions you have with your health  care provider.   Document Released: 07/20/2003 Document Revised: 05/20/2014 Document Reviewed: 04/16/2013 Elsevier Interactive Patient Education [employer]

## 2015-09-25 NOTE — Progress Notes (Signed)
S: c/o midepigastric pain, sometimes has heartburn but sx go away, pain in outer epigastric area is always there, increased with movement, bulges when he goes to sit up, no fever/chills/v/d  O: vitals wnl, nad, abd soft nontender, slight weakness at abdominal wall upon doing crunch, no bowel protrusion, bs normal all 4 quads, n/v intact  A: abdominal pain, ventral hernia  P: kub xray, prilosec for heartburn as needed, decrease size of weights while doing squats

## 2015-09-26 NOTE — Progress Notes (Signed)
Left message for patient to contact office to informed him that xray of abd was normal and to try  laxative

## 2016-03-14 DIAGNOSIS — B977 Papillomavirus as the cause of diseases classified elsewhere: Secondary | ICD-10-CM | POA: Insufficient documentation

## 2016-12-18 ENCOUNTER — Ambulatory Visit: Payer: Self-pay | Admitting: Physician Assistant

## 2016-12-19 NOTE — Progress Notes (Signed)
Late entry, epic down yesterday S: pt c/o sore throat on and off for 2 months, states his uvula has gotten really long, is snoring a lot, thinks its pnd but not sure, denies fever/chills  O: vitals wnl, nad, tms clear, throat red, uvula is extremely long lands on his tongue, neck supple no lymph, lungs c t a, cv rrr  A: sore throat  P: amoxil, refer to ENT

## 2016-12-26 NOTE — Progress Notes (Signed)
Faxed over referral request,demographic and progress note to [facility] attn: Wilkie AyeKristy

## 2016-12-31 NOTE — Progress Notes (Addendum)
Received fax from [facility] ENT appointment scheduled on 01/10/2017 @ 9:45 with Dr. Willeen Cass [address]. Attempted to reach out patient via phone unable to leave message. Per patient request Wanted a later time which was not available until 01/30/17 @ 3:30  Patient ok'd the date and time

## 2018-09-24 LAB — HM HIV SCREENING LAB: HM HIV Screening: NEGATIVE

## 2019-01-20 ENCOUNTER — Ambulatory Visit: Payer: Self-pay

## 2019-01-27 ENCOUNTER — Telehealth: Payer: Self-pay

## 2019-01-27 NOTE — Telephone Encounter (Signed)
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  Had rapid testing at [facility] on 01/20/19- Trep reactive and confirmatory negative.   Per Doren Custard [place] hence why his appt is not until 02/05/19. Aileen Fass, RN

## 2019-02-04 DIAGNOSIS — B977 Papillomavirus as the cause of diseases classified elsewhere: Secondary | ICD-10-CM

## 2019-02-05 ENCOUNTER — Other Ambulatory Visit: Payer: Self-pay

## 2019-02-05 ENCOUNTER — Ambulatory Visit: Payer: BC Managed Care – PPO | Admitting: Physician Assistant

## 2019-02-05 DIAGNOSIS — Z113 Encounter for screening for infections with a predominantly sexual mode of transmission: Secondary | ICD-10-CM

## 2019-02-05 DIAGNOSIS — Z202 Contact with and (suspected) exposure to infections with a predominantly sexual mode of transmission: Secondary | ICD-10-CM

## 2019-02-05 MED ORDER — PENICILLIN G BENZATHINE 1200000 UNIT/2ML IM SUSP
2.4000 10*6.[IU] | Freq: Once | INTRAMUSCULAR | Status: AC
  Administered 2019-02-05: 18:00:00 2.4 10*6.[IU] via INTRAMUSCULAR

## 2019-02-06 ENCOUNTER — Encounter: Payer: Self-pay | Admitting: Physician Assistant

## 2019-02-06 NOTE — Progress Notes (Signed)
    STI clinic/screening visit  Subjective:  Tracy Herring is a 48 y.o. male being seen today for an STI screening visit. The patient reports they do not have symptoms.  Patient has the following medical conditions:   Patient Active Problem List   Diagnosis Date Noted  . 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 SEXUALLY TRANSMITTED DISEASE    HPI  Patient reports that he was sent in by DIS as a contact to Syphilis.  Patient denies any symptoms and states that he has had a negative test earlier this month.  Agrees to testing and treatment today.  See flowsheet for further details and programmatic requirements.    The following portions of the patient's history were reviewed and updated as appropriate: allergies, current medications, past medical history, past social history, past surgical history and problem list.  Objective:  There were no vitals filed for this visit.  Physical Exam Constitutional:      General: He is not in acute distress.    Appearance: Normal appearance.  HENT:     Head: Normocephalic and atraumatic.     Mouth/Throat:     Mouth: Mucous membranes are moist.     Pharynx: Oropharynx is clear. No oropharyngeal exudate or posterior oropharyngeal erythema.  Eyes:     Conjunctiva/sclera: Conjunctivae normal.  Neck:     Musculoskeletal: Neck supple.  Pulmonary:     Effort: Pulmonary effort is normal.  Abdominal:     Palpations: Abdomen is soft. There is no mass.     Tenderness: There is no abdominal tenderness. There is no guarding or rebound.  Genitourinary:    Penis: Normal.      Scrotum/Testes: Normal.     Rectum: Normal.     Comments: Pubic area without nits, lice, edema, erythema, lesions or inguinal adenopathy. Penis circumcised without discharge at meatus. Lymphadenopathy:     Cervical: No cervical adenopathy.  Skin:    General: Skin is warm and dry.     Findings: No bruising,  erythema, lesion or rash.  Neurological:     Mental Status: He is alert and oriented to person, place, and time.  Psychiatric:        Mood and Affect: Mood normal.        Behavior: Behavior normal.        Thought Content: Thought content normal.       Assessment and Plan:  Tracy Herring is a 48 y.o. male presenting to the [facility] for STI screening  1. Screening for STD (sexually transmitted disease) Patient without symptoms today. Rec condoms with all sex. Await test results.  Counseled that RN or DIS will contact him if he needs further treatment or follow up once results are back. - [facility] - HIV  LAB - Syphilis Serology,  Lab  2. Venereal disease contact Will treat as a contact to Syphilis with Bicillin 2.4 mu IM today. No sex for 14 days and until after partner completes treatment. Patient observed for 15 minutes and released without problems due to patient having to return to work.  - penicillin g benzathine (BICILLIN LA) 1200000 UNIT/2ML injection 2.4 Million Units     No follow-ups on file.  No future appointments.  Jerene Dilling, PA

## 2019-10-06 ENCOUNTER — Encounter: Payer: Self-pay | Admitting: Physician Assistant

## 2019-10-06 ENCOUNTER — Other Ambulatory Visit: Payer: Self-pay

## 2019-10-06 ENCOUNTER — Ambulatory Visit: Payer: BC Managed Care – PPO | Admitting: Physician Assistant

## 2019-10-06 DIAGNOSIS — Z202 Contact with and (suspected) exposure to infections with a predominantly sexual mode of transmission: Secondary | ICD-10-CM

## 2019-10-06 DIAGNOSIS — Z113 Encounter for screening for infections with a predominantly sexual mode of transmission: Secondary | ICD-10-CM

## 2019-10-06 MED ORDER — AZITHROMYCIN 500 MG PO TABS
1000.0000 mg | ORAL_TABLET | Freq: Once | ORAL | Status: AC
  Administered 2019-10-06: 1000 mg via ORAL

## 2019-10-06 NOTE — Progress Notes (Signed)
Pt here for STD screening and reports he is a contact to Chlamydia.Lyman Speller, RN

## 2019-10-08 NOTE — Progress Notes (Signed)
   [facility] Department STI clinic/screening visit  Subjective:  Tracy Herring is a 49 y.o. male being seen today for an STI screening visit. The patient reports they do not have symptoms.    Patient has the following medical conditions:   Patient Active Problem List   Diagnosis Date Noted  . 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 SEXUALLY TRANSMITTED DISEASE    Chlamydia contact    HPI  Patient reports that he does not have any symptoms but is a contact to Chlamydia.  Declines blood work and screening exam today due to time constraints and requests treatment only.   See flowsheet for further details and programmatic requirements.    The following portions of the patient's history were reviewed and updated as appropriate: allergies, current medications, past medical history, past social history, past surgical history and problem list.  Objective:  There were no vitals filed for this visit.  Physical Exam Constitutional:      General: He is not in acute distress.    Appearance: Normal appearance.  HENT:     Head: Normocephalic and atraumatic.  Eyes:     Conjunctiva/sclera: Conjunctivae normal.  Pulmonary:     Effort: Pulmonary effort is normal.  Neurological:     Mental Status: He is alert and oriented to person, place, and time.  Psychiatric:        Mood and Affect: Mood normal.        Behavior: Behavior normal.        Thought Content: Thought content normal.        Judgment: Judgment normal.       Assessment and Plan:  Tracy Herring is a 49 y.o. male presenting to the [facility] Department for STI screening  1. Screening for STD (sexually transmitted disease) Patient into clinic without symptoms. Rec condoms with all sex. Patient declines screening exam and blood work today due to time constraints.  2. 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 No sex for 7 days and until after partner completes treatment.  RTC for re-treatment if vomits < 2 hr after taking medicine. - azithromycin (ZITHROMAX) tablet 1,000 mg     No follow-ups on file.  No future appointments.  Matt Holmes, PA

## 2020-04-25 ENCOUNTER — Telehealth: Payer: Self-pay | Admitting: Oncology

## 2020-04-25 NOTE — Telephone Encounter (Signed)
Re: Mab infusion  Call patient to discuss monoclonal antibody infusion.  Unfortunately he is beyond 10 days of onset of his symptoms.  Symptom onset was 04/15/2020.  Patient appears to be feeling much better.  Durenda Hurt, NP 04/25/2020 1:06 PM

## 2020-05-10 ENCOUNTER — Emergency Department
Admission: EM | Admit: 2020-05-10 | Discharge: 2020-05-11 | Disposition: A | Discharge status: Home / Self Care | Payer: BC Managed Care – PPO | Attending: Emergency Medicine | Admitting: Emergency Medicine

## 2020-05-10 ENCOUNTER — Emergency Department: Payer: BC Managed Care – PPO

## 2020-05-10 DIAGNOSIS — U071 COVID-19: Secondary | ICD-10-CM | POA: Diagnosis not present

## 2020-05-10 DIAGNOSIS — R0781 Pleurodynia: Secondary | ICD-10-CM | POA: Diagnosis present

## 2020-05-10 LAB — COMPREHENSIVE METABOLIC PANEL
ALT: 45 U/L — ABNORMAL HIGH (ref 0–44)
AST: 23 U/L (ref 15–41)
Albumin: 3.8 g/dL (ref 3.5–5.0)
Alkaline Phosphatase: 87 U/L (ref 38–126)
Anion gap: 9 (ref 5–15)
BUN: 7 mg/dL (ref 6–20)
CO2: 25 mmol/L (ref 22–32)
Calcium: 9.2 mg/dL (ref 8.9–10.3)
Chloride: 106 mmol/L (ref 98–111)
Creatinine, Ser: 0.84 mg/dL (ref 0.61–1.24)
GFR, Estimated: >60 mL/min (ref >60)
Glucose, Bld: 94 mg/dL (ref 70–99)
Potassium: 4.3 mmol/L (ref 3.5–5.1)
Sodium: 140 mmol/L (ref 135–145)
Total Bilirubin: 0.6 mg/dL (ref 0.3–1.2)
Total Protein: 7.8 g/dL (ref 6.5–8.1)

## 2020-05-10 LAB — CBC WITH DIFFERENTIAL/PLATELET
Abs Immature Granulocytes: 0.02 10*3/uL (ref 0.00–0.07)
Basophils Absolute: 0.1 10*3/uL (ref 0.0–0.1)
Basophils Relative: 1 %
Eosinophils Absolute: 0.2 10*3/uL (ref 0.0–0.5)
Eosinophils Relative: 4 %
HCT: 38.7 % — ABNORMAL LOW (ref 39.0–52.0)
Hemoglobin: 12.9 g/dL — ABNORMAL LOW (ref 13.0–17.0)
Immature Granulocytes: 0 %
Lymphocytes Relative: 42 %
Lymphs Abs: 2.5 10*3/uL (ref 0.7–4.0)
MCH: 32.2 pg (ref 26.0–34.0)
MCHC: 33.3 g/dL (ref 30.0–36.0)
MCV: 96.5 fL (ref 80.0–100.0)
Monocytes Absolute: 0.4 10*3/uL (ref 0.1–1.0)
Monocytes Relative: 7 %
Neutro Abs: 2.7 10*3/uL (ref 1.7–7.7)
Neutrophils Relative %: 46 %
Platelets: 334 10*3/uL (ref 150–400)
RBC: 4.01 MIL/uL — ABNORMAL LOW (ref 4.22–5.81)
RDW: 12.4 % (ref 11.5–15.5)
WBC: 5.9 10*3/uL (ref 4.0–10.5)
nRBC: 0 % (ref 0.0–0.2)

## 2020-05-10 LAB — TROPONIN I (HIGH SENSITIVITY): Troponin I (High Sensitivity): 3 ng/L (ref <18)

## 2020-05-10 MED ORDER — PREDNISONE 20 MG PO TABS
60.0000 mg | ORAL_TABLET | Freq: Once | ORAL | Status: AC
  Administered 2020-05-11: 60 mg via ORAL
  Filled 2020-05-10: qty 3

## 2020-05-10 MED ORDER — IOHEXOL 350 MG/ML SOLN
75.0000 mL | Freq: Once | INTRAVENOUS | Status: AC | PRN
  Administered 2020-05-10: 75 mL via INTRAVENOUS
  Filled 2020-05-10: qty 75

## 2020-05-10 NOTE — ED Triage Notes (Signed)
Pt presents to ER with c/o chest pain with inspiration, L & R side back, cough, and headache.  Pt states he had COVID back on 12/4 and has been having these symptoms since then.  Pt A&O x4 in triage.  Pt in NAD at this time.

## 2020-05-10 NOTE — ED Provider Notes (Signed)
[facility] Emergency Department Provider Note  ____________________________________________  Time seen: Approximately 10:05 PM  I have reviewed the triage vital signs and the nursing notes.   HISTORY  Chief Complaint Chest Pain    HPI Tracy Herring is a 49 y.o. male who presents the emergency department complaining of pleuritic chest pain x3 weeks.  Patient states that he was diagnosed with Covid approximately a month ago.  He seemed to improve from the Covid symptoms, though he does have a slight light cough remaining.  Patient states that he is developed a pleuritic type chest pain in the posterior chest over the past 2 to 3 weeks.  Patient states that it is constant, worse with inspiration.  There is no shortness of breath associated.  Patient denies this being a substernal type pain.  No radiation into the arms or jaw.  No cardiac history.  No shortness of breath.   Patient has no fevers or chills, URI symptoms, abdominal complaints at this time.  No medications for this complaint prior to arrival.        No past medical history on file.  Patient Active Problem List   Diagnosis Date Noted  . 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 Authorizing Provider  benzonatate (TESSALON PERLES) 100 MG capsule Take 1 capsule (100 mg total) by mouth every 6 (six) hours as needed. 05/11/20 05/11/21 Yes Benzion Mesta, Delorise Royals, PA-C  HYDROcodone-homatropine (HYCODAN) 5-1.5 MG/5ML syrup Take 5 mLs by mouth every 6 (six) hours as needed for cough. 05/11/20  Yes Areeba Sulser, Delorise Royals, PA-C  predniSONE (DELTASONE) 10 MG tablet Take 1 tablet (10 mg total) by mouth daily. 05/11/20  Yes Twain Stenseth, Delorise Royals, PA-C  omeprazole (PRILOSEC) 20 MG capsule Take 1 capsule (20 mg total) by mouth daily. 09/25/15   Faythe Ghee, PA-C    Allergies Patient has no known allergies.  No  family history on file.  Social History Social History   Tobacco Use  . Smoking status: Never Smoker  . Smokeless tobacco: Never Used  Substance Use Topics  . Alcohol use: Never    Alcohol/week: 0.0 standard drinks  . Drug use: Never     Review of Systems  Constitutional: No fever/chills Eyes: No visual changes. No discharge ENT: No upper respiratory complaints. Cardiovascular: Pleuritic chest pain Respiratory: no cough. No SOB. Gastrointestinal: No abdominal pain.  No nausea, no vomiting.  No diarrhea.  No constipation. Musculoskeletal: Negative for musculoskeletal pain. Skin: Negative for rash, abrasions, lacerations, ecchymosis. Neurological: Negative for headaches, focal weakness or numbness.  10 System ROS otherwise negative.  ____________________________________________   PHYSICAL EXAM:  VITAL SIGNS: ED Triage Vitals  Enc Vitals Group     BP      Pulse      Resp      Temp      Temp src      SpO2      Weight      Height      Head Circumference      Peak Flow      Pain Score      Pain Loc      Pain Edu?      Excl. in GC?      Constitutional: Alert and oriented. Well appearing and in no acute distress. Eyes: Conjunctivae are normal. PERRL. EOMI. Head: Atraumatic. ENT:      Ears: EACs  and TMs unremarkable bilaterally      Nose: No congestion/rhinnorhea.      Mouth/Throat: Mucous membranes are moist.  Neck: No stridor.   Cardiovascular: Normal rate, regular rhythm. Normal S1 and S2.No murmurs, rubs, gallops.  No muffled heart sounds.  No apical heave.  Good peripheral circulation.   Respiratory: Normal respiratory effort without tachypnea or retractions. Lungs CTAB with specifically no wheezing, rales or rhonchi. Good air entry to the bases with no decreased or absent breath sounds. Gastrointestinal: Bowel sounds 4 quadrants. Soft and nontender to palpation. No guarding or rigidity. No palpable masses. No distention. No CVA tenderness Musculoskeletal:  Full range of motion to all extremities. No gross deformities appreciated. Neurologic:  Normal speech and language. No gross focal neurologic deficits are appreciated.  Skin:  Skin is warm, dry and intact. No rash noted. Psychiatric: Mood and affect are normal. Speech and behavior are normal. Patient exhibits appropriate insight and judgement.   ____________________________________________   LABS (all labs ordered are listed, but only abnormal results are displayed)  Labs Reviewed  COMPREHENSIVE METABOLIC PANEL - Abnormal; Notable for the following components:      Result Value   ALT 45 (*)    All other components within normal limits  CBC WITH DIFFERENTIAL/PLATELET - Abnormal; Notable for the following components:   RBC 4.01 (*)    Hemoglobin 12.9 (*)    HCT 38.7 (*)    All other components within normal limits  TROPONIN I (HIGH SENSITIVITY)   ____________________________________________  EKG   ____________________________________________  RADIOLOGY I personally viewed and evaluated these images as part of my medical decision making, as well as reviewing the written report by the radiologist.  ED Provider Interpretation: Findings consistent with atypical/viral pneumonia which is consistent with the patient's Covid status.  No evidence of superimposed bacterial infection or PE.  CT Angio Chest PE W and/or Wo Contrast  Result Date: 05/10/2020 CLINICAL DATA:  COVID in early December, continuing symptoms EXAM: CT ANGIOGRAPHY CHEST WITH CONTRAST TECHNIQUE: Multidetector CT imaging of the chest was performed using the standard protocol during bolus administration of intravenous contrast. Multiplanar CT image reconstructions and MIPs were obtained to evaluate the vascular anatomy. CONTRAST:  37mL OMNIPAQUE IOHEXOL 350 MG/ML SOLN COMPARISON:  None. FINDINGS: Cardiovascular: There is a optimal opacification of the pulmonary arteries. There is no central,segmental, or subsegmental  filling defects within the pulmonary arteries. The heart is normal in size. No pericardial effusion or thickening. No evidence right heart strain. There is normal three-vessel brachiocephalic anatomy without proximal stenosis. The thoracic aorta is normal in appearance. Mediastinum/Nodes: No hilar, mediastinal, or axillary adenopathy. Thyroid gland, trachea, and esophagus demonstrate no significant findings. Lungs/Pleura: Multifocal patchy airspace opacities are seen throughout the periphery of both lungs and the lung bases. No pleural effusion or pneumothorax is seen. Upper Abdomen: No acute abnormalities present in the visualized portions of the upper abdomen. Musculoskeletal: No chest wall abnormality. No acute or significant osseous findings. Review of the MIP images confirms the above findings. IMPRESSION: No central, segmental, or subsegmental pulmonary embolism. Multifocal airspace opacities, consistent with atypical viral pneumonia. Electronically Signed   By: Jonna Clark M.D.   On: 05/10/2020 23:45    ____________________________________________    PROCEDURES  Procedure(s) performed:    Procedures    Medications  predniSONE (DELTASONE) tablet 60 mg (has no administration in time range)  iohexol (OMNIPAQUE) 350 MG/ML injection 75 mL (75 mLs Intravenous Contrast Given 05/10/20 2333)     ____________________________________________  INITIAL IMPRESSION / ASSESSMENT AND PLAN / ED COURSE  Pertinent labs & imaging results that were available during my care of the patient were reviewed by me and considered in my medical decision making (see chart for details).  Review of the [place] CSRS was performed in accordance of the [facility] prior to dispensing any controlled drugs.           Patient's diagnosis is consistent with pleuritic chest pain, COVID-19.  Patient presented to the emergency department with pleuritic chest pain localized primarily in the right lower lung field.  Patient has  been experiencing symptoms for several weeks and had Covid diagnosis 4 weeks ago.  Slight ongoing cough.  Evaluation of the patient revealed overall reassuring physical exam.  Differential included COVID-19, secondary bacterial pneumonia, PE, myocarditis/endocarditis/pericarditis.  Work-up today is reassuring with no evidence of PE.  No evidence of superimposed infection.  I feel that symptoms are pleurodynia secondary to inflammation from his COVID-19.  Patient will be placed on prednisone course, symptom control medications of cough medicine as well.  At this time no indication for further work-up.  Patient is stable for discharge.  Follow-up primary care as needed. Patient is given ED precautions to return to the ED for any worsening or new symptoms.     ____________________________________________  FINAL CLINICAL IMPRESSION(S) / ED DIAGNOSES  Final diagnoses:  COVID-19  Pleuritic chest pain      NEW MEDICATIONS STARTED DURING THIS VISIT:  ED Discharge Orders         Ordered    predniSONE (DELTASONE) 10 MG tablet  Daily       Note to Pharmacy: Take 6 pills x 2 days, 5 pills x 2 days, 4 pills x 2 days, 3 pills x 2 days, 2 pills x 2 days, and 1 pill x 2 days   05/11/20 0003    benzonatate (TESSALON PERLES) 100 MG capsule  Every 6 hours PRN        05/11/20 0003    HYDROcodone-homatropine (HYCODAN) 5-1.5 MG/5ML syrup  Every 6 hours PRN        05/11/20 0003              This chart was dictated using voice recognition software/Dragon. Despite best efforts to proofread, errors can occur which can change the meaning. Any change was purely unintentional.    Racheal Patches, PA-C 05/11/20 0005    Sharyn Creamer, MD 05/18/20 2212

## 2020-05-11 MED ORDER — BENZONATATE 100 MG PO CAPS: 100.0000 mg | ORAL_CAPSULE | Freq: Four times a day (QID) | ORAL | 1 refills | Status: AC | PRN

## 2020-05-11 MED ORDER — HYDROCODONE-HOMATROPINE 5-1.5 MG/5ML PO SYRP: 5.0000 mL | ORAL_SOLUTION | Freq: Four times a day (QID) | ORAL | 0 refills | Status: DC | PRN

## 2020-05-11 MED ORDER — PREDNISONE 10 MG PO TABS: 10.0000 mg | ORAL_TABLET | Freq: Every day | ORAL | 0 refills | Status: DC

## 2020-10-19 ENCOUNTER — Ambulatory Visit: Payer: Self-pay | Admitting: Family Medicine

## 2020-10-19 ENCOUNTER — Other Ambulatory Visit: Payer: Self-pay

## 2020-10-19 DIAGNOSIS — Z113 Encounter for screening for infections with a predominantly sexual mode of transmission: Secondary | ICD-10-CM

## 2020-10-19 NOTE — Progress Notes (Signed)
   [facility] Department STI clinic/screening visit  Subjective:  Tracy Herring is a 50 y.o. male being seen today for an STI screening visit. The patient reports they do not have symptoms.    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     See flowsheet for further details and programmatic requirements.    The following portions of the patient's history were reviewed and updated as appropriate: allergies, current medications, past medical history, past social history, past surgical history and problem list.  Objective:  There were no vitals filed for this visit.  Physical Exam Constitutional:      Appearance: Normal appearance.  HENT:     Head: Normocephalic.     Mouth/Throat:     Mouth: Mucous membranes are moist.     Pharynx: Oropharynx is clear. No oropharyngeal exudate.  Pulmonary:     Effort: Pulmonary effort is normal.  Genitourinary:    Comments: Pt declined exam  Musculoskeletal:     Cervical back: Normal range of motion and neck supple.  Lymphadenopathy:     Cervical: No cervical adenopathy.  Skin:    General: Skin is warm and dry.     Findings: No bruising, erythema, lesion or rash.  Neurological:     Mental Status: He is alert and oriented to person, place, and time.  Psychiatric:        Mood and Affect: Mood normal.        Behavior: Behavior normal.     Assessment and Plan:  Tracy Herring is a 50 y.o. male presenting to the [facility] Department for STI screening  1. Screening examination for venereal disease  - HBV Antigen/Antibody [facility] Lab - Syphilis Serology, [place] [facility] - HIV [place] [facility]  Patient does not have STI  symptoms Patient declined all screenings including oral, urine, rectal CT/GC and accepted bloodwork for HIV/RPR.  Patient meets criteria for HepB screening? Yes. Ordered? Yes Patient meets criteria for HepC screening? No. Ordered? No - does not meet criteria  Recommended condom use with all sex Discussed importance of condom use for STI prevent  Discussed time line for State Lab results and that patient will be called with positive results and encouraged patient to call if he had not heard in 2 weeks.    Return for as needed.  No future appointments.  Wendi Snipes, FNP

## 2021-01-05 ENCOUNTER — Ambulatory Visit: Payer: Self-pay

## 2021-01-11 ENCOUNTER — Other Ambulatory Visit: Payer: Self-pay

## 2021-01-11 ENCOUNTER — Ambulatory Visit: Payer: BC Managed Care – PPO | Admitting: Physician Assistant

## 2021-01-11 DIAGNOSIS — B3749 Other urogenital candidiasis: Secondary | ICD-10-CM

## 2021-01-11 DIAGNOSIS — Z113 Encounter for screening for infections with a predominantly sexual mode of transmission: Secondary | ICD-10-CM

## 2021-01-12 ENCOUNTER — Encounter: Payer: Self-pay | Admitting: Physician Assistant

## 2021-01-12 MED ORDER — CLOTRIMAZOLE 1 % EX CREA
1.0000 | TOPICAL_CREAM | Freq: Two times a day (BID) | CUTANEOUS | 0 refills | Status: AC
Start: 2021-01-12 — End: 2021-01-26

## 2021-01-12 NOTE — Progress Notes (Signed)
[facility] Department STI clinic/screening visit  Subjective:  Tracy Herring is a 50 y.o. male being seen today for an STI screening visit. The patient reports they do have symptoms.    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  Denies chronic conditions and regular medicines.  Reports last HIV test was in May of this year and last void prior to sample collection for GC/Chlamydia testing.    Screening for MPX risk: Does the patient have an unexplained rash? No Is the patient MSM? Yes Does the patient endorse multiple sex partners or anonymous sex partners? No Did the patient have close or sexual contact with a person diagnosed with MPX? No Has the patient traveled outside the [place] where MPX is endemic? No Is there a high clinical suspicion for MPX-- evidenced by one of the following No  -Unlikely to be chickenpox  -Lymphadenopathy  -Rash that present in same phase of evolution on any given body part   See flowsheet for further details and programmatic requirements.    The following portions of the patient's history were reviewed and updated as appropriate: allergies, current medications, past medical history, past social history, past surgical history and problem list.  Objective:  There were no vitals filed for this visit.  Physical Exam Constitutional:      General: He is not in acute distress.    Appearance: Normal appearance.  HENT:     Head: Normocephalic and atraumatic.     Comments: No nits,lice, or hair loss. No cervical, supraclavicular or axillary adenopathy.     Mouth/Throat:     Mouth: Mucous membranes  are moist.     Pharynx: Oropharynx is clear. No oropharyngeal exudate or posterior oropharyngeal erythema.  Eyes:     Conjunctiva/sclera: Conjunctivae normal.  Pulmonary:     Effort: Pulmonary effort is normal.  Abdominal:     Palpations: Abdomen is soft. There is no mass.     Tenderness: There is no abdominal tenderness. There is no guarding or rebound.  Genitourinary:    Penis: Normal.      Testes: Normal.     Rectum: Normal.     Comments: Pubic area without nits, lice, hair loss, edema, erythema, lesions and inguinal adenopathy. Penis uncircumcised without rash, lesions and discharge at meatus. Patient with mild erythema and small amount of whitish scaling on head of penis. Testicles descended bilaterally,nt, no masses or edema.  Musculoskeletal:     Cervical back: Neck supple. No tenderness.  Skin:    General: Skin is warm and dry.     Findings: No bruising, erythema, lesion or rash.  Neurological:     Mental Status: He is alert and oriented to person, place, and time.  Psychiatric:        Mood and Affect: Mood normal.        Behavior: Behavior normal.        Thought Content: Thought content normal.        Judgment: Judgment normal.      Assessment and Plan:  Tracy Herring is a 50 y.o. male presenting to the [facility] Department for  STI screening  1. Screening for STD (sexually transmitted disease) Patient into clinic with symptoms. Rec condoms with all sex. Await test results.  Counseled that RN will call if needs to RTC for treatment once results are back.  - Chlamydia/Gonorrhea [facility] - HIV [place] [facility] - Syphilis Serology, [facility] - Chlamydia/Gonorrhea [facility] - Chlamydia/Gonorrhea [facility]  2. Yeast dermatitis of penis Will give Clotrimazole 1% cream to apply BID for 14 days to affected area. Enc patient to retract foreskin regularly to allow area air flow to keep dry and clean. - clotrimazole (LOTRIMIN) 1 % cream;  Apply 1 application topically 2 (two) times daily for 14 days.  Dispense: 30 g; Refill: 0    No follow-ups on file.  No future appointments.  Matt Holmes, PA

## 2022-06-26 ENCOUNTER — Emergency Department
Admission: EM | Admit: 2022-06-26 | Discharge: 2022-06-26 | Disposition: A | Discharge status: Home / Self Care | Payer: BC Managed Care – PPO | Attending: Emergency Medicine | Admitting: Emergency Medicine

## 2022-06-26 ENCOUNTER — Other Ambulatory Visit: Payer: Self-pay

## 2022-06-26 ENCOUNTER — Emergency Department: Payer: BC Managed Care – PPO

## 2022-06-26 DIAGNOSIS — F4321 Adjustment disorder with depressed mood: Secondary | ICD-10-CM

## 2022-06-26 DIAGNOSIS — R42 Dizziness and giddiness: Secondary | ICD-10-CM

## 2022-06-26 DIAGNOSIS — M6790 Unspecified disorder of synovium and tendon, unspecified site: Secondary | ICD-10-CM | POA: Insufficient documentation

## 2022-06-26 DIAGNOSIS — F4381 Prolonged grief disorder: Secondary | ICD-10-CM | POA: Insufficient documentation

## 2022-06-26 DIAGNOSIS — I4510 Unspecified right bundle-branch block: Secondary | ICD-10-CM | POA: Insufficient documentation

## 2022-06-26 DIAGNOSIS — M779 Enthesopathy, unspecified: Secondary | ICD-10-CM

## 2022-06-26 LAB — BASIC METABOLIC PANEL
Anion gap: 9 (ref 5–15)
BUN: 11 mg/dL (ref 6–20)
CO2: 27 mmol/L (ref 22–32)
Calcium: 9.2 mg/dL (ref 8.9–10.3)
Chloride: 103 mmol/L (ref 98–111)
Creatinine, Ser: 1 mg/dL (ref 0.61–1.24)
GFR, Estimated: >60 mL/min (ref >60)
Glucose, Bld: 96 mg/dL (ref 70–99)
Potassium: 3.7 mmol/L (ref 3.5–5.1)
Sodium: 139 mmol/L (ref 135–145)

## 2022-06-26 LAB — CBC WITH DIFFERENTIAL/PLATELET
Abs Immature Granulocytes: 0 10*3/uL (ref 0.00–0.07)
Basophils Absolute: 0.1 10*3/uL (ref 0.0–0.1)
Basophils Relative: 2 %
Eosinophils Absolute: 0.4 10*3/uL (ref 0.0–0.5)
Eosinophils Relative: 8 %
HCT: 39.4 % (ref 39.0–52.0)
Hemoglobin: 13.2 g/dL (ref 13.0–17.0)
Immature Granulocytes: 0 %
Lymphocytes Relative: 51 %
Lymphs Abs: 2.3 10*3/uL (ref 0.7–4.0)
MCH: 31.6 pg (ref 26.0–34.0)
MCHC: 33.5 g/dL (ref 30.0–36.0)
MCV: 94.3 fL (ref 80.0–100.0)
Monocytes Absolute: 0.3 10*3/uL (ref 0.1–1.0)
Monocytes Relative: 6 %
Neutro Abs: 1.5 10*3/uL — ABNORMAL LOW (ref 1.7–7.7)
Neutrophils Relative %: 33 %
Platelets: 254 10*3/uL (ref 150–400)
RBC: 4.18 MIL/uL — ABNORMAL LOW (ref 4.22–5.81)
RDW: 12.3 % (ref 11.5–15.5)
WBC: 4.4 10*3/uL (ref 4.0–10.5)
nRBC: 0 % (ref 0.0–0.2)

## 2022-06-26 LAB — TROPONIN I (HIGH SENSITIVITY): Troponin I (High Sensitivity): <2 ng/L (ref <18)

## 2022-06-26 MED ORDER — HYDROXYZINE PAMOATE 50 MG PO CAPS
100.0000 mg | ORAL_CAPSULE | Freq: Three times a day (TID) | ORAL | 0 refills | Status: DC | PRN
  Filled 2022-06-26: qty 60, 10d supply, fill #0

## 2022-06-26 MED ORDER — FLUTICASONE PROPIONATE 50 MCG/ACT NA SUSP
1.0000 | Freq: Two times a day (BID) | NASAL | 0 refills | Status: DC
  Filled 2022-06-26: qty 16, 30d supply, fill #0

## 2022-06-26 MED ORDER — MELOXICAM 15 MG PO TABS
15.0000 mg | ORAL_TABLET | Freq: Every day | ORAL | 0 refills | Status: DC
  Filled 2022-06-26: qty 30, 30d supply, fill #0

## 2022-06-26 MED ORDER — MECLIZINE HCL 25 MG PO TABS
25.0000 mg | ORAL_TABLET | Freq: Three times a day (TID) | ORAL | 0 refills | Status: DC | PRN
  Filled 2022-06-26: qty 30, 10d supply, fill #0

## 2022-06-26 NOTE — ED Triage Notes (Signed)
Patient reports intermittent episodes of dizziness and chest pain x 3 days (ablation 12 years ago for A-fib); He states that that he has been feeling down and anxious after his sister's passing 3 weeks ago and believes that may be the cause of his symptoms

## 2022-06-26 NOTE — ED Provider Notes (Signed)
[facility] Provider Note  Patient Contact: 6:07 PM (approximate)   History   Dizziness (Patient reports intermittent episodes of dizziness and chest pain x 3 days (ablation 12 years ago for A-fib); He states that that he has been feeling down and anxious after his sister's passing 3 weeks ago and believes that may be the cause of his symptoms)   HPI  Tracy Herring is a 52 y.o. male who presents emergency for several symptoms.  Patient states that he was going to [facility] clinic walk-in complaining about some joint pain.  He states that he had had some joint narrowing develop while he was working out.  He has been working out less recently due to some family/emotional issues he has been working through.  Patient states that when he went to urgent care he also mentioned that he was having some palpitations and chest tightness.  Patient reports that he lost his sister 3 weeks ago to cancer and has been struggling with this.  He states that he is not suicidal homicidal but has had significant pain and emotional distress from losing her.  He states that they were very close and talked every day on the phone.  Patient states that it is worse at nighttime and he has felt most of the symptoms develop at nighttime.  He has also noticed that occasionally while having the chest tightness he will also having vertigo-like symptoms.  No headache, vision changes, unilateral weakness.  He currently has no chest pain or shortness of breath.  He does have a history of A-fib that was managed with an ablation has had no issues since.     Physical Exam   Triage Vital Signs: ED Triage Vitals [06/26/22 1617]  Enc Vitals Group     BP (!) 152/94     Pulse Rate 61     Resp 19     Temp 98.1 F (36.7 C)     Temp Source Oral     SpO2 96 %     Weight 177 lb (80.3 kg)     Height 5' 10"$  (1.778 m)     Head Circumference      Peak Flow      Pain Score 4     Pain Loc      Pain  Edu?      Excl. in [place]?     Most recent vital signs: Vitals:   06/26/22 1617  BP: (!) 152/94  Pulse: 61  Resp: 19  Temp: 98.1 F (36.7 C)  SpO2: 96%     General: Alert and in no acute distress. Eyes:  PERRL. EOMI. ENT:      Ears:       Nose: No congestion/rhinnorhea.  TM is bulging on the left, noninjected.  TM is unremarkable on right.      Mouth/Throat: Mucous membranes are moist. Neck: No stridor. No cervical spine tenderness to palpation.  Cardiovascular:  Good peripheral perfusion.  No appreciable murmurs, rubs, gallops.  No apical heave. Respiratory: Normal respiratory effort without tachypnea or retractions. Lungs CTAB. Good air entry to the bases with no decreased or absent breath sounds Musculoskeletal: Full range of motion to all extremities.  Neurologic:  No gross focal neurologic deficits are appreciated.  Cranial nerves II through XII grossly intact. Skin:   No rash noted Other: Patient endorses profound sadness following the loss of his sister.  However no concerning psychiatric complaints of suicidal, homicidal or severe depression of symptoms  ED Results / Procedures / Treatments   Labs (all labs ordered are listed, but only abnormal results are displayed) Labs Reviewed  CBC WITH DIFFERENTIAL/PLATELET - Abnormal; Notable for the following components:      Result Value   RBC 4.18 (*)    Neutro Abs 1.5 (*)    All other components within normal limits  BASIC METABOLIC PANEL  TROPONIN I (HIGH SENSITIVITY)  TROPONIN I (HIGH SENSITIVITY)     EKG  ED ECG REPORT I, Charline Bills Liticia Gasior,  personally viewed and interpreted this ECG.   Date: 06/26/2022  EKG Time: 1616 hrs.  Rate: 61 bpm  Rhythm: unchanged from previous tracings, normal sinus rhythm, incomplete right bundle branch block  Axis: Rightward axis  Intervals: Incomplete right bundle branch block with RSR pattern but no widening of the QRS  ST&T Change: No gross ST elevation or depression  noted.  Normal sinus rhythm.  No evidence of STEMI.  Patient has incomplete right bundle branch block.    RADIOLOGY  I personally viewed, evaluated, and interpreted these images as part of my medical decision making, as well as reviewing the written report by the radiologist.  ED Provider Interpretation: No acute cardiopulmonary findings on chest x-ray  DG Chest 2 View  Result Date: 06/26/2022 CLINICAL DATA:  Dizziness and chest pain x3 days. EXAM: CHEST - 2 VIEW COMPARISON:  July 12, 2008 FINDINGS: The heart size and mediastinal contours are within normal limits. The lungs are mildly hyperinflated. Both lungs are clear. The visualized skeletal structures are unremarkable. IMPRESSION: No active cardiopulmonary disease. Electronically Signed   By: Virgina Norfolk M.D.   On: 06/26/2022 16:34    PROCEDURES:  Critical Care performed: No  Procedures   MEDICATIONS ORDERED IN ED: Medications - No data to display   IMPRESSION / MDM / [place] / ED COURSE  I reviewed the triage vital signs and the nursing notes.                                 Differential diagnosis includes, but is not limited to, stress reaction, STEMI, NSTEMI/ACS, grief, vertigo, eustachian tube dysfunction   Patient's presentation is most consistent with acute presentation with potential threat to life or bodily function.   Patient's diagnosis is consistent with grief, vertigo, tendinitis.  Patient presents the emergency department several complaints.  Emergently he has been seen at urgent care for what appears to be tendinitis.  While there he mentioned that he was struggling due to the loss of his sister recently.  There is no concerning psychiatric component requiring evaluation by psychiatry.  Patient is experiencing a normal grieving process.  Had had some intermittent chest pain, tightness and palpitations.  History of A-fib.  EKG is reassuring.  No evidence of STEMI on EKG and no evidence of  cardiac damage on labs.  Patient with reassuring chest x-ray.  At this time I will treat patient's vertigo, tendinitis and give as needed medication for his grief.  Patient is agreeable with this plan.  Return precautions discussed with patient at length.  Follow-up primary care as needed..  Patient is given ED precautions to return to the ED for any worsening or new symptoms.     FINAL CLINICAL IMPRESSION(S) / ED DIAGNOSES   Final diagnoses:  Vertigo  Grief reaction  Tendinitis     Rx / DC Orders   ED Discharge Orders  Ordered    meloxicam (MOBIC) 15 MG tablet  Daily        06/26/22 1819    meclizine (ANTIVERT) 25 MG tablet  3 times daily PRN        06/26/22 1819    fluticasone (FLONASE) 50 MCG/ACT nasal spray  2 times daily        06/26/22 1819    hydrOXYzine (VISTARIL) 100 MG capsule  3 times daily PRN        06/26/22 1819             Note:  This document was prepared using Dragon voice recognition software and may include unintentional dictation errors.   Brynda Peon 06/26/22 1819    Arta Silence, MD 06/27/22 [phone]

## 2022-06-26 NOTE — ED Notes (Signed)
At this time, dizziness and chest pain has subsided. Pt given d/c paperwork and told to come back if his chest pain returns. Roderic Palau, [place] okay with him leaving without second troponin

## 2022-06-27 ENCOUNTER — Other Ambulatory Visit: Payer: Self-pay

## 2022-07-03 ENCOUNTER — Other Ambulatory Visit: Payer: Self-pay

## 2022-08-12 ENCOUNTER — Other Ambulatory Visit: Payer: Self-pay

## 2022-08-12 MED ORDER — MELOXICAM 15 MG PO TABS
15.0000 mg | ORAL_TABLET | Freq: Every day | ORAL | 0 refills | Status: DC
  Filled 2022-08-12: qty 30, 30d supply, fill #0

## 2022-09-17 ENCOUNTER — Other Ambulatory Visit: Payer: Self-pay

## 2022-09-17 MED ORDER — SILDENAFIL CITRATE 100 MG PO TABS
100.0000 mg | ORAL_TABLET | Freq: Every day | ORAL | 0 refills | Status: AC | PRN
  Filled 2022-09-17: qty 10, 10d supply, fill #0
  Filled 2022-10-11: qty 10, 10d supply, fill #1
  Filled 2023-05-15: qty 10, 10d supply, fill #2

## 2022-10-11 ENCOUNTER — Other Ambulatory Visit: Payer: Self-pay

## 2023-05-15 ENCOUNTER — Other Ambulatory Visit: Payer: Self-pay

## 2023-07-31 ENCOUNTER — Ambulatory Visit: Payer: Self-pay | Admitting: Nurse Practitioner

## 2023-07-31 ENCOUNTER — Encounter: Payer: Self-pay | Admitting: Nurse Practitioner

## 2023-07-31 DIAGNOSIS — Z113 Encounter for screening for infections with a predominantly sexual mode of transmission: Secondary | ICD-10-CM

## 2023-07-31 LAB — HM HIV SCREENING LAB: HM HIV Screening: NEGATIVE

## 2023-07-31 LAB — HM HEPATITIS C SCREENING LAB: HM Hepatitis Screen: NEGATIVE

## 2023-07-31 LAB — HEPATITIS B SURFACE ANTIGEN

## 2023-07-31 NOTE — Progress Notes (Signed)
 [facility] Department STI clinic [address], [address] [place] [address] Main phone: [phone]  STI screening visit  Subjective:  Tracy Herring is a 53 y.o. male being seen today for an STI screening visit. The patient reports they do have symptoms.    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 male who presents to the office today requesting symptomatic STI testing. The patient states he is having an increased amount of smegma around his penis head from under the foreskin. He denies discharge from the urethra.  He reports 1 male (same sex) partner in the last 2 months, and practices receptive anal sex and oral sex. Patient reports using condoms sometimes. Patient indicates a history of gonorrhea, chlamydia, and HPV. He reports last sex was about 2 weeks ago.   STI screening history: Last HIV test per patient/review of record was  Lab Results  Component Value Date   HMHIVSCREEN Negative - Validated 09/24/2018    Last HEPC test per patient/review of record was No results found for: "HMHEPCSCREEN" No components found for: "HEPC"   Last HEPB test per patient/review of record was No components found for: "HMHEPBSCREEN"   Fertility: Does the patient or their partner desires a pregnancy in the next year? No  Screening for MPX risk: Does the patient have an unexplained rash? No Is the patient MSM? Yes Does the patient endorse multiple sex partners or anonymous sex partners? No Did the patient have close or sexual contact with a person diagnosed with MPX? No Has the patient traveled outside the [place] where MPX is endemic? No Is  there a high clinical suspicion for MPX-- evidenced by one of the following No  -Unlikely to be chickenpox  -Lymphadenopathy  -Rash that present in same phase of evolution on any given body part   See flowsheet for further details and programmatic requirements.    There is no immunization history on file for this patient.   The following portions of the patient's history were reviewed and updated as appropriate: allergies, current medications, past medical history, past social history, past surgical history and problem list.  Objective:  There were no vitals filed for this visit.  Physical Exam Nursing note reviewed. Chaperone present: Declined.  Constitutional:      Appearance: Normal appearance.  HENT:     Head: Normocephalic.     Salivary Glands: Right salivary gland is not diffusely enlarged or tender. Left salivary gland is not diffusely enlarged or tender.     Mouth/Throat:     Lips: Pink. No lesions.     Mouth: Mucous membranes are moist. No oral lesions.     Tongue: No lesions. Tongue does not deviate from midline.     Pharynx: Oropharynx is clear. Uvula midline. No oropharyngeal exudate or posterior oropharyngeal erythema.     Tonsils: No tonsillar exudate.  Eyes:     General:        Right eye: No discharge.        Left eye: No discharge.     Conjunctiva/sclera:     Right eye: Right conjunctiva is not injected. No exudate.    Left eye:  Left conjunctiva is not injected. No exudate. Pulmonary:     Effort: Pulmonary effort is normal.  Genitourinary:    Pubic Area: No rash or pubic lice.      Penis: Normal and uncircumcised. No tenderness, discharge, swelling or lesions.      Testes: Normal.     Epididymis:     Right: Normal. No mass or tenderness.     Left: Normal. No mass or tenderness.     Tanner stage (genital): 5.     Comments: No abnormalities seen on genital exam. Lymphadenopathy:     Head:     Right side of head: No submental, submandibular, tonsillar,  preauricular or posterior auricular adenopathy.     Left side of head: No submental, submandibular, tonsillar, preauricular or posterior auricular adenopathy.     Cervical: No cervical adenopathy.     Right cervical: No superficial or posterior cervical adenopathy.    Left cervical: No superficial or posterior cervical adenopathy.     Upper Body:     Right upper body: No supraclavicular or axillary adenopathy.     Left upper body: No supraclavicular or axillary adenopathy.     Lower Body: No right inguinal adenopathy. No left inguinal adenopathy.  Skin:    General: Skin is warm and dry.     Findings: No lesion or rash.     Comments: Skin tone appropriate for ethnicity.   Neurological:     Mental Status: He is alert and oriented to person, place, and time.  Psychiatric:        Attention and Perception: Attention and perception normal.        Mood and Affect: Mood and affect normal.        Speech: Speech normal.        Behavior: Behavior normal. Behavior is cooperative.        Thought Content: Thought content normal.     Assessment and Plan:  Hasnain Herring is a 53 y.o. male presenting to the [facility] Department for STI screening  1. Screening for venereal disease (Primary) Discussed genital hygiene with patient and encouraged daily cleansing of penile head with foreskin retracted using a mild unscented soap. Advised patient he may need to clean the area more frequently. Patient verbalized understanding and agreed with plan.   - HBV Antigen/Antibody [facility] - HIV/HCV [place] [facility] - Syphilis Serology, [place] [facility] - Chlamydia/GC NAA, Confirmation (urine) - Chlamydia/Gonorrhea [place] [facility] (oral) - Chlamydia/Gonorrhea [facility] (rectal)  Patient does have STI symptoms Patient accepted all screenings including  urine GC/Chlamydia, and blood work for HIV/Syphilis. Patient meets criteria for HepB screening? Yes. Ordered? yes Patient meets criteria for  HepC screening? Yes. Ordered? yes Recommended condom use with all sex Discussed importance of condom use for STI prevention  Treat positive test results per standing order. Discussed time line for State Lab results and that patient will be called with positive results and encouraged patient to call if he had not heard in 2 weeks Recommended repeat testing in 3 months with positive results. Recommended returning for continued or worsening symptoms.   Return if symptoms worsen or fail to improve.  No future appointments.  Total time with patient 30 minutes.   Edmonia James, NP

## 2023-07-31 NOTE — Progress Notes (Signed)
 Pt is here for STD screening. Condoms given. Sonda Primes, RN.

## 2023-08-04 LAB — CHLAMYDIA/GC NAA, CONFIRMATION
Chlamydia trachomatis, NAA: NEGATIVE
Neisseria gonorrhoeae, NAA: NEGATIVE

## 2023-08-08 ENCOUNTER — Telehealth: Payer: Self-pay

## 2023-08-08 ENCOUNTER — Telehealth: Payer: Self-pay | Admitting: Family Medicine

## 2023-08-08 NOTE — Telephone Encounter (Signed)
 Patient called in requesting his HIV test results from his recent appointment. Would like a call from the nurse regarding the results or the time frame in which he can expect to receive the results.

## 2023-08-08 NOTE — Telephone Encounter (Signed)
 Called client to discuss Hepatitis B test results that were drawn on 07/31/23. Advised client his test results interpretation is unclear ,may be a resolved infection ,chronic infection or a false positive and recommended getting rechecked in 6months . Educated/counseled on Hep B. Client asked if any of his other test results were back and CL/GC was negative, all other labs still pending. Client verbalized understanding.

## 2023-09-16 ENCOUNTER — Emergency Department
Admission: EM | Admit: 2023-09-16 | Discharge: 2023-09-16 | Disposition: A | Discharge status: Home / Self Care | Attending: Emergency Medicine | Admitting: Emergency Medicine

## 2023-09-16 ENCOUNTER — Emergency Department

## 2023-09-16 ENCOUNTER — Other Ambulatory Visit: Payer: Self-pay

## 2023-09-16 DIAGNOSIS — N179 Acute kidney failure, unspecified: Secondary | ICD-10-CM | POA: Diagnosis not present

## 2023-09-16 DIAGNOSIS — R7402 Elevation of levels of lactic acid dehydrogenase (LDH): Secondary | ICD-10-CM | POA: Diagnosis not present

## 2023-09-16 DIAGNOSIS — R55 Syncope and collapse: Secondary | ICD-10-CM | POA: Diagnosis present

## 2023-09-16 DIAGNOSIS — E860 Dehydration: Secondary | ICD-10-CM | POA: Insufficient documentation

## 2023-09-16 LAB — CBC
HCT: 39.1 % (ref 39.0–52.0)
Hemoglobin: 13.4 g/dL (ref 13.0–17.0)
MCH: 32.3 pg (ref 26.0–34.0)
MCHC: 34.3 g/dL (ref 30.0–36.0)
MCV: 94.2 fL (ref 80.0–100.0)
Platelets: 273 10*3/uL (ref 150–400)
RBC: 4.15 MIL/uL — ABNORMAL LOW (ref 4.22–5.81)
RDW: 12.1 % (ref 11.5–15.5)
WBC: 5.3 10*3/uL (ref 4.0–10.5)
nRBC: 0 % (ref 0.0–0.2)

## 2023-09-16 LAB — COMPREHENSIVE METABOLIC PANEL WITH GFR
ALT: 26 U/L (ref 0–44)
AST: 34 U/L (ref 15–41)
Albumin: 4.1 g/dL (ref 3.5–5.0)
Alkaline Phosphatase: 68 U/L (ref 38–126)
Anion gap: 15 (ref 5–15)
BUN: 15 mg/dL (ref 6–20)
CO2: 20 mmol/L — ABNORMAL LOW (ref 22–32)
Calcium: 9.6 mg/dL (ref 8.9–10.3)
Chloride: 104 mmol/L (ref 98–111)
Creatinine, Ser: 1.43 mg/dL — ABNORMAL HIGH (ref 0.61–1.24)
GFR, Estimated: 59 mL/min — ABNORMAL LOW (ref >60)
Glucose, Bld: 120 mg/dL — ABNORMAL HIGH (ref 70–99)
Potassium: 3.7 mmol/L (ref 3.5–5.1)
Sodium: 139 mmol/L (ref 135–145)
Total Bilirubin: 0.7 mg/dL (ref 0.0–1.2)
Total Protein: 7.5 g/dL (ref 6.5–8.1)

## 2023-09-16 LAB — URINALYSIS, W/ REFLEX TO CULTURE (INFECTION SUSPECTED)
Bacteria, UA: NONE SEEN
Bilirubin Urine: NEGATIVE
Glucose, UA: NEGATIVE mg/dL
Hgb urine dipstick: NEGATIVE
Ketones, ur: NEGATIVE mg/dL
Leukocytes,Ua: NEGATIVE
Nitrite: NEGATIVE
Protein, ur: NEGATIVE mg/dL
Specific Gravity, Urine: 1.003 — ABNORMAL LOW (ref 1.005–1.030)
Squamous Epithelial / HPF: 0 /HPF (ref 0–5)
pH: 7 (ref 5.0–8.0)

## 2023-09-16 LAB — LACTIC ACID, PLASMA
Lactic Acid, Venous: 7.2 mmol/L (ref 0.5–1.9)
Lactic Acid, Venous: 2.3 mmol/L (ref 0.5–1.9)

## 2023-09-16 LAB — TROPONIN I (HIGH SENSITIVITY)
Troponin I (High Sensitivity): 3 ng/L (ref <18)
Troponin I (High Sensitivity): 6 ng/L (ref <18)

## 2023-09-16 LAB — CBG MONITORING, ED: Glucose-Capillary: 106 mg/dL — ABNORMAL HIGH (ref 70–99)

## 2023-09-16 LAB — CK: Total CK: 297 U/L (ref 49–397)

## 2023-09-16 LAB — BRAIN NATRIURETIC PEPTIDE: B Natriuretic Peptide: 10.6 pg/mL (ref 0.0–100.0)

## 2023-09-16 MED ORDER — LACTATED RINGERS IV BOLUS
1000.0000 mL | Freq: Once | INTRAVENOUS | Status: AC
  Administered 2023-09-16: 1000 mL via INTRAVENOUS

## 2023-09-16 NOTE — ED Provider Notes (Signed)
 [facility] Provider Note    Event Date/Time   First MD Initiated Contact with Patient 09/16/23 1742     (approximate)   History   Loss of Consciousness   HPI  Tracy Herring is a 53 y.o. male with history of SVT, h/o ablation, no longer follows with cardiology, presenting for syncopal episode.  Patient states that he worked out today at [employer], states that 15 minutes after the workout, felt palpitations, lightheadedness, sat in a chair and was told he passed out.  He denies any trauma or falls.  No chest pain or shortness of breath.  States that he had some shortness of breath after his workout that is typical for him.  Did not think that he was exerting himself more than usual.  No leg swelling, no unilateral calf swelling or tenderness, no recent travel or surgeries, no history of malignancy or DVT.  No hormone use.  States that he was in a wheelchair coming down to the emergency department when he felt lightheaded again, had palpitations and passed out.  States that he is asymptomatic right now.  On independent chart review, he was seen by cardiology in 2015 for SVT, had no recurrent SVT with them, did discussed exercise daily and avoiding stimulants, he is not on any medications for the SVT.     Physical Exam   Triage Vital Signs: ED Triage Vitals  Encounter Vitals Group     BP 09/16/23 1738 (!) 88/58     Systolic BP Percentile --      Diastolic BP Percentile --      Pulse Rate 09/16/23 1738 (!) 104     Resp 09/16/23 1738 18     Temp 09/16/23 1738 97.8 F (36.6 C)     Temp Source 09/16/23 1738 Oral     SpO2 09/16/23 1738 100 %     Weight 09/16/23 1739 187 lb (84.8 kg)     Height 09/16/23 1739 5\' 10"  (1.778 m)     Head Circumference --      Peak Flow --      Pain Score 09/16/23 1738 0     Pain Loc --      Pain Education --      Exclude from Growth Chart --     Most recent vital signs: Vitals:   09/16/23 1752 09/16/23 2152  BP:  105/61 120/76  Pulse: 88 87  Resp: 16 18  Temp:    SpO2: 94% 100%     General: Awake, no distress.  CV:  Good peripheral perfusion.  Resp:  Normal effort.  No respiratory distress or tachypnea Abd:  No distention.  Soft nontender Other:  No lower extremity edema, no unilateral calf cellular tenderness, mildly dry mucous membranes.   ED Results / Procedures / Treatments   Labs (all labs ordered are listed, but only abnormal results are displayed) Labs Reviewed  COMPREHENSIVE METABOLIC PANEL WITH GFR - Abnormal; Notable for the following components:      Result Value   CO2 20 (*)    Glucose, Bld 120 (*)    Creatinine, Ser 1.43 (*)    GFR, Estimated 59 (*)    All other components within normal limits  CBC - Abnormal; Notable for the following components:   RBC 4.15 (*)    All other components within normal limits  URINALYSIS, W/ REFLEX TO CULTURE (INFECTION SUSPECTED) - Abnormal; Notable for the following components:   Color, Urine COLORLESS (*)  APPearance CLEAR (*)    Specific Gravity, Urine 1.003 (*)    All other components within normal limits  LACTIC ACID, PLASMA - Abnormal; Notable for the following components:   Lactic Acid, Venous 7.2 (*)    All other components within normal limits  LACTIC ACID, PLASMA - Abnormal; Notable for the following components:   Lactic Acid, Venous 2.3 (*)    All other components within normal limits  CBG MONITORING, ED - Abnormal; Notable for the following components:   Glucose-Capillary 106 (*)    All other components within normal limits  BRAIN NATRIURETIC PEPTIDE  CK  CBG MONITORING, ED  TROPONIN I (HIGH SENSITIVITY)  TROPONIN I (HIGH SENSITIVITY)     EKG  EKG shows, sinus tachycardia, rate 102, normal QRS, normal QTc, T wave flattening in aVL, no ischemic ST elevation, not significant compared to prior   RADIOLOGY On my independent interpretation, chest x-ray without focal consolidation   PROCEDURES:  Critical Care  performed: No  Procedures   MEDICATIONS ORDERED IN ED: Medications  lactated ringers bolus 1,000 mL (0 mLs Intravenous Stopped 09/16/23 1904)  lactated ringers bolus 1,000 mL (1,000 mLs Intravenous Bolus 09/16/23 1937)     IMPRESSION / MDM / ASSESSMENT AND PLAN / ED COURSE  I reviewed the triage vital signs and the nursing notes.                              Differential diagnosis includes, but is not limited to, arrhythmia, atypical ACS, electrolyte derangement, dehydration, consider PE but patient has no risk factors for it, not hypoxic.  Will get labs, EKG, troponin, chest x-ray, IV fluids, cardiac monitoring, reassess.  Patient's presentation is most consistent with acute presentation with potential threat to life or bodily function.  Independent interpretation of labs and imaging below.  Patient was observed in the emergency department without recurrence of his symptoms.  Was ambulatory multiple times without lightheadedness.  Shared decision making to patient and he is agreeable with plan for discharge and outpatient follow-up, will give his cardiologist call to follow-up, also follow-up with his primary care doctor to get repeat labs to make sure that his kidney function is improving.  Discussed with him about labs and imaging results including incidental findings, encouraged hydration, recommended no exertional activities until cleared by his cardiologist.  Otherwise considered but no indication for inpatient admission at this time, he is safe for outpatient management.  Will discharge with strict return precautions.  The patient is on the cardiac monitor to evaluate for evidence of arrhythmia and/or significant heart rate changes.   Clinical Course as of 09/16/23 2302  Tue Sep 16, 2023  1956 Independent review of labs, electrolytes not severely deranged, he does have an AKI, no leukocytosis, BNP is normal, troponin is normal, lactic acid is elevated but he has no infectious symptoms  at this time, suspect might be due to the syncope instead of sepsis.  He is getting fluids. [TT]  2027 DG Chest 2 View No acute cardiopulmonary process.  [TT]  2028 Lactic Acid, Venous(!!): 2.3 Lactate is clearing. [TT]  2033 CK Total: 297 Not elevated [TT]  2046 Urinalysis, w/ Reflex to Culture (Infection Suspected) -Urine, Clean Catch(!) Not consistent with UTI. [TT]  2046 Troponin I (High Sensitivity) Troponin x 2 is negative. [TT]    Clinical Course User Index [TT] Drenda Gentle Richard Champion, MD     FINAL CLINICAL IMPRESSION(S) / ED DIAGNOSES  Final diagnoses:  Syncope, unspecified syncope type  AKI (acute kidney injury) (HCC)  Dehydration     Rx / DC Orders   ED Discharge Orders     None        Note:  This document was prepared using Dragon voice recognition software and may include unintentional dictation errors.    Shane Darling, MD 09/16/23 2302

## 2023-09-16 NOTE — Discharge Instructions (Signed)
 Please make sure to keep yourself hydrated, please avoid any exercise or exertional activity until you are able to see your cardiologist.  Please follow-up with your primary care doctor as well to get repeat labs to make sure that your kidney function is improving.

## 2023-09-16 NOTE — ED Notes (Signed)
 Pt given a bag meal per request.

## 2023-09-16 NOTE — ED Notes (Signed)
 Lab called and informed that the lactic lab has a chart label on the tube due to the printer running out of paper

## 2023-09-16 NOTE — ED Notes (Signed)
 See triage note  Presents from the hospital gym    Had s feeling that his heart was racing    then had a syncopal episode    Denies any pain

## 2023-09-16 NOTE — ED Triage Notes (Signed)
 Pt was working out at the hospital gym and started feeling his heart race and had a syncopal episode. Had a second syncopal episode while being wheeled in a wheelchair down to the ED. Pt A&OX4 at time of triage. Pt appears to be diaphoretic and hypotensive. Denies pain. IV placed and patient roomed at this time

## 2023-09-16 NOTE — ED Notes (Signed)
 This paramedic called [facility] and they advised me that the pt is on the monitoring board, however there is a problem on their end and they can not see any rhythms from [facility] and that our charge nurse should have let us  know so that we can stop calling them to add pt's.

## 2023-11-25 ENCOUNTER — Ambulatory Visit: Payer: Self-pay

## 2023-11-25 ENCOUNTER — Ambulatory Visit: Payer: Self-pay | Admitting: Family Medicine

## 2023-11-25 DIAGNOSIS — Z113 Encounter for screening for infections with a predominantly sexual mode of transmission: Secondary | ICD-10-CM

## 2023-11-25 LAB — HM HIV SCREENING LAB: HM HIV Screening: NEGATIVE

## 2023-11-25 LAB — HM HEPATITIS C SCREENING LAB: HM Hepatitis Screen: NEGATIVE

## 2023-11-25 NOTE — Progress Notes (Signed)
 [facility] Department STI clinic [address] [place] [address] Main phone: [phone]  STI screening visit  Subjective:  Tracy Herring is a 53 y.o. male being seen today for an STI screening visit. The patient reports they do not have symptoms.    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 No concerns about specific STI exposures.   See flowsheet for further details and programmatic requirements  Hyperlink available at the top of the signed note in blue.  Flow sheet content below:  Pregnancy Intention Screening Does the patient want to become pregnant in the next year?: No Does the patient's partner want to become pregnant in the next year?: No Would the patient like to discuss contraceptive options today?: N/A All Patients Anyone smoke around pt and/or pt's children?: No Anyone smoke inside pt's house?: No Anyone smoke inside car?: No Anyone smoke inside the workplace?: No Reason For STD Screen STD Screening: Is asymptomatic Have you ever had an STD?: No History of Antibiotic use in the past 2 weeks?: No STD Symptoms Denies all: Yes Risk Factors for Hep B Household, sexual, or needle sharing contact of a person infected with Hep B: No Sexual contact with a person who uses drugs not as prescribed?: No Currently or Ever used drugs not as prescribed: No HIV Positive: No PRep Patient: No (1 partner uses PREP) Men who have sex with men: Yes Have Hepatitis C: No History of Incarceration: No History of Homeslessness?: No Anal sex following anal drug use?: No Risk Factors for Hep C Currently using drugs not as  prescribed: No Sexual partner(s) currently using drugs as not prescribed: No History of drug use: No HIV Positive: No (1 partner on PREP) People with a history of incarceration: No People born between the years of [birth year] and 25: No Hepatitis Counseling Hep B Counseling: Counseled patient about increased risk of Hep B and recommendation for testing, Patient accepts testing for Hep B today Abuse History Has patient ever been abused physically?: No Has patient ever been abused sexually?: No Does patient feel they have a problem with Anxiety?: No Does patient feel they have a problem with Depression?: No Referral to Behavioral Health: No Counseling Patient counseled to use condoms with all sex: Condoms given RTC in 2-3 weeks for test results: Yes Clinic will call if test results abnormal before test result appt.: Yes Test results given to patient Patient counseled to use condoms with all sex: Condoms given  Screening for MPX risk: Does the patient have an unexplained rash? No Is the patient MSM? Yes Does the patient endorse multiple sex partners or anonymous sex partners? Yes Did the patient have close or sexual contact with a person diagnosed with MPX? No Has the patient traveled outside the [place]  where MPX is endemic? No Is there a high clinical suspicion for MPX-- evidenced by one of the following No  -Unlikely to be chickenpox  -Lymphadenopathy  -Rash that present in same phase of evolution on any given body part  STI screening history: Last HIV test per patient/review of record was  Lab Results  Component Value Date   HMHIVSCREEN Negative - Validated 07/31/2023   No results found for:  HIV  Last HEPC test per patient/review of record was  Lab Results  Component Value Date   HMHEPCSCREEN Negative-Validated 07/31/2023   No components found for: HEPC   Last HEPB test per patient/review of record was No components found for: HMHEPBSCREEN   Fertility: Does the patient or  their partner desires a pregnancy in the next year? No   There is no immunization history on file for this patient.  The following portions of the patient's history were reviewed and updated as appropriate: allergies, current medications, past medical history, past social history, past surgical history and problem list.  Objective:  There were no vitals filed for this visit.  Physical Exam Exam conducted with a chaperone present Calleen Northern, RN).  Constitutional:      Appearance: Normal appearance.  HENT:     Head: Normocephalic and atraumatic.     Comments: No nits or hair loss    Mouth/Throat:     Mouth: Mucous membranes are moist. No oral lesions.     Pharynx: Oropharynx is clear. No oropharyngeal exudate or posterior oropharyngeal erythema.  Eyes:     General: No scleral icterus.       Right eye: No discharge.        Left eye: No discharge.     Conjunctiva/sclera:     Right eye: Right conjunctiva is not injected. No exudate.    Left eye: Left conjunctiva is not injected. No exudate. Pulmonary:     Effort: Pulmonary effort is normal.  Abdominal:     Palpations: There is no hepatomegaly.  Genitourinary:    Pubic Area: No rash or pubic lice (no nits).      Penis: No tenderness, discharge, swelling or lesions.      Rectum: Normal. No tenderness (no lesions or discharge), anal fissure or external hemorrhoid.  Musculoskeletal:        General: Normal range of motion.     Cervical back: Neck supple. No rigidity or tenderness.  Lymphadenopathy:     Head:     Right side of head: No preauricular or posterior auricular adenopathy.     Left side of head: No preauricular or posterior auricular adenopathy.     Cervical: No cervical adenopathy.     Upper Body:     Right upper body: No supraclavicular adenopathy.     Left upper body: No supraclavicular adenopathy.  Skin:    General: Skin is warm and dry.     Capillary Refill: Capillary refill takes less than 2 seconds.      Coloration: Skin is not jaundiced or pale.     Findings: No bruising, erythema, lesion or rash.  Neurological:     General: No focal deficit present.     Mental Status: He is alert and oriented to person, place, and time.  Psychiatric:        Mood and Affect: Mood normal.        Behavior: Behavior normal.    Assessment and Plan:  Tracy Herring is a 53 y.o. male presenting to the [facility] Department for STI screening  Screening examination for venereal disease -     Syphilis Serology, [place] [facility] -     Chlamydia/Gonorrhea [place] [facility] -     HIV/HCV [place] [facility] -     HBV Antigen/Antibody [facility] -     Chlamydia/Gonorrhea [place] [facility] -     Chlamydia/GC NAA, Confirmation  Patient does not have STI symptoms Patient accepted  the following screenings: anal CT/GC NAAT swab, oral CT/GC NAAT swab, urine CT/GC, HIV, RPR, Hep B, and Hep C Patient meets criteria for HepB screening? Yes. Ordered? yes Patient meets criteria for HepC screening? Yes. Ordered? yes Recommended condom use with all sex Discussed importance of condom use for STI prevention  Treat positive test results per standing order. Discussed time line for State Lab results and that patient will be called with positive results and encouraged patient to call if he had not heard in 2 weeks Recommended repeat testing in 3 months with positive results. Recommended returning for continued or worsening symptoms.   No follow-ups on file.  No future appointments.  Betsey CHRISTELLA Helling, MD

## 2023-11-25 NOTE — Patient Instructions (Signed)
 STI screening - Today we obtained a urine sample to screen for gonorrhea and chlamydia, oral swab to screen for gonorrhea and chlamydia, and anal swab to screen for gonorrhea and chlamydia - We also obtained a blood sample to screen for HIV, syphilis, Hepatitis B, and Hepatitis C  - If the results are abnormal, I will give you a call.    Estimated time frame for results collected at the [facility] Department: Same day Trichomonas Yeast BV (bacterial vaginosis)  Within 1-2 weeks Gonorrhea Chlamydia  Within 2-3 weeks HIV Syphilis Hepatitis B Hepatitis C

## 2023-11-27 LAB — HBV ANTIGEN/ANTIBODY STATE LAB
Hep B Core Total Ab: NONREACTIVE
Hep B S Ab: NONREACTIVE
Hepatitis B Surface Antigen: NONREACTIVE

## 2023-11-28 LAB — CHLAMYDIA/GC NAA, CONFIRMATION
Chlamydia trachomatis, NAA: NEGATIVE
Neisseria gonorrhoeae, NAA: NEGATIVE

## 2023-12-05 ENCOUNTER — Encounter: Payer: Self-pay | Admitting: Family Medicine

## 2024-01-16 ENCOUNTER — Emergency Department

## 2024-01-16 ENCOUNTER — Emergency Department
Admission: EM | Admit: 2024-01-16 | Discharge: 2024-01-16 | Disposition: A | Discharge status: Home / Self Care | Attending: Emergency Medicine | Admitting: Emergency Medicine

## 2024-01-16 ENCOUNTER — Other Ambulatory Visit: Payer: Self-pay

## 2024-01-16 DIAGNOSIS — R0789 Other chest pain: Secondary | ICD-10-CM | POA: Insufficient documentation

## 2024-01-16 DIAGNOSIS — R197 Diarrhea, unspecified: Secondary | ICD-10-CM | POA: Diagnosis not present

## 2024-01-16 LAB — CBC
HCT: 42.4 % (ref 39.0–52.0)
Hemoglobin: 13.9 g/dL (ref 13.0–17.0)
MCH: 31.2 pg (ref 26.0–34.0)
MCHC: 32.8 g/dL (ref 30.0–36.0)
MCV: 95.3 fL (ref 80.0–100.0)
Platelets: 250 K/uL (ref 150–400)
RBC: 4.45 MIL/uL (ref 4.22–5.81)
RDW: 12.2 % (ref 11.5–15.5)
WBC: 4.4 K/uL (ref 4.0–10.5)
nRBC: 0 % (ref 0.0–0.2)

## 2024-01-16 LAB — COMPREHENSIVE METABOLIC PANEL WITH GFR
ALT: 25 U/L (ref 0–44)
AST: 26 U/L (ref 15–41)
Albumin: 3.8 g/dL (ref 3.5–5.0)
Alkaline Phosphatase: 62 U/L (ref 38–126)
Anion gap: 11 (ref 5–15)
BUN: 9 mg/dL (ref 6–20)
CO2: 27 mmol/L (ref 22–32)
Calcium: 9.3 mg/dL (ref 8.9–10.3)
Chloride: 104 mmol/L (ref 98–111)
Creatinine, Ser: 1.09 mg/dL (ref 0.61–1.24)
GFR, Estimated: >60 mL/min (ref >60)
Glucose, Bld: 106 mg/dL — ABNORMAL HIGH (ref 70–99)
Potassium: 4 mmol/L (ref 3.5–5.1)
Sodium: 142 mmol/L (ref 135–145)
Total Bilirubin: 0.5 mg/dL (ref 0.0–1.2)
Total Protein: 6.9 g/dL (ref 6.5–8.1)

## 2024-01-16 LAB — D-DIMER, QUANTITATIVE: D-Dimer, Quant: 0.37 ug{FEU}/mL (ref 0.00–0.50)

## 2024-01-16 LAB — TROPONIN I (HIGH SENSITIVITY): Troponin I (High Sensitivity): 3 ng/L (ref <18)

## 2024-01-16 LAB — LIPASE, BLOOD: Lipase: 31 U/L (ref 11–51)

## 2024-01-16 MED ORDER — KETOROLAC TROMETHAMINE 30 MG/ML IJ SOLN
15.0000 mg | Freq: Once | INTRAMUSCULAR | Status: AC
  Administered 2024-01-16: 15 mg via INTRAVENOUS
  Filled 2024-01-16: qty 1

## 2024-01-16 MED ORDER — LIDOCAINE 5 % EX PTCH
1.0000 | MEDICATED_PATCH | Freq: Once | CUTANEOUS | Status: DC
  Administered 2024-01-16: 1 via TRANSDERMAL
  Filled 2024-01-16: qty 1

## 2024-01-16 MED ORDER — NAPROXEN 500 MG PO TABS
500.0000 mg | ORAL_TABLET | Freq: Two times a day (BID) | ORAL | 0 refills | Status: AC
  Filled 2024-01-16: qty 14, 7d supply, fill #0

## 2024-01-16 NOTE — Discharge Instructions (Signed)
 You were seen in the ER today for evaluation of pain along your left chest wall.  Your testing here was fortunately overall reassuring.  I suspect you may have a strain of the muscles in this area.  I sent a prescription for a course of anti-inflammatories to your pharmacy.  You can also take Tylenol 650 mg every 8 hours and use over-the-counter lidocaine  patches to help with your symptoms.  Follow with your primary care doctor if your symptoms are not improved within a few days.  Return to the ER for new or worsening symptoms.

## 2024-01-16 NOTE — ED Provider Notes (Signed)
 [facility] Provider Note    Event Date/Time   First MD Initiated Contact with Patient 01/16/24 [phone]     (approximate)   History   Abdominal Pain   HPI  Tracy Herring is a 53 year old male with history of SVT presenting to the ER for evaluation of left lower chest pain.  Yesterday, patient noticed pain along his left lower chest, worse with deep breaths.  Does have an area that is tender to palpation, but denies any identifiable injuries, heavy lifting.  No history of similar.  No nausea, vomiting.  Reports a few episodes of diarrhea which is not atypical for him.    Physical Exam   Triage Vital Signs: ED Triage Vitals  Encounter Vitals Group     BP 01/16/24 0745 123/82     Girls Systolic BP Percentile --      Girls Diastolic BP Percentile --      Boys Systolic BP Percentile --      Boys Diastolic BP Percentile --      Pulse Rate 01/16/24 0745 66     Resp 01/16/24 0745 17     Temp 01/16/24 0745 98.2 F (36.8 C)     Temp Source 01/16/24 0745 Oral     SpO2 01/16/24 0745 98 %     Weight 01/16/24 0745 182 lb (82.6 kg)     Height 01/16/24 0745 5' 10 (1.778 m)     Head Circumference --      Peak Flow --      Pain Score 01/16/24 0747 5     Pain Loc --      Pain Education --      Exclude from Growth Chart --     Most recent vital signs: Vitals:   01/16/24 0838 01/16/24 0839  BP:    Pulse: 61 (!) 53  Resp: 12 10  Temp:    SpO2: 99% 99%     General: Awake, interactive  CV:  Regular rate, good peripheral perfusion.  Resp:  Unlabored respirations, lungs clear to auscultation Chest wall: There is reproducible tenderness over the left anterolateral lower chest wall without overlying skin changes Abd:  Nondistended, soft, nontender to palpation diffusely Neuro:  Symmetric facial movement, fluid speech   ED Results / Procedures / Treatments   Labs (all labs ordered are listed, but only abnormal results are displayed) Labs  Reviewed  COMPREHENSIVE METABOLIC PANEL WITH GFR - Abnormal; Notable for the following components:      Result Value   Glucose, Bld 106 (*)    All other components within normal limits  LIPASE, BLOOD  CBC  D-DIMER, QUANTITATIVE  URINALYSIS, ROUTINE W REFLEX MICROSCOPIC  TROPONIN I (HIGH SENSITIVITY)     EKG EKG independently reviewed and interpreted by myself demonstrates:  EKG sinus rhythm at a rate of 55, PR 148, QRS 106, QTc 413.  Possible incomplete right bundle branch block morphology noted with minimal ST elevation, no STEMI, similar to prior  RADIOLOGY Imaging independently reviewed and interpreted by myself demonstrates:  CXR with focal consolidation or rib fracture   Formal Radiology Read:  DG Ribs Unilateral W/Chest Left Result Date: 01/16/2024 CLINICAL DATA:  Lower chest wall pain.  No reported injury. EXAM: LEFT RIBS AND CHEST - 3+ VIEW COMPARISON:  09/16/2023 FINDINGS: No fracture or other bone lesions are seen involving the ribs. There is no evidence of pneumothorax or pleural effusion. Both lungs are clear. Heart size and mediastinal contours are within normal  limits. IMPRESSION: Negative. Electronically Signed   By: Elspeth Bathe M.D.   On: 01/16/2024 08:31    PROCEDURES:  Critical Care performed: No  Procedures   MEDICATIONS ORDERED IN ED: Medications  lidocaine  (LIDODERM ) 5 % 1 patch (1 patch Transdermal Patch Applied 01/16/24 0805)  ketorolac  (TORADOL ) 30 MG/ML injection 15 mg (15 mg Intravenous Given 01/16/24 0814)     IMPRESSION / MDM / ASSESSMENT AND PLAN / ED COURSE  I reviewed the triage vital signs and the nursing notes.  Differential diagnosis includes, but is not limited to, musculoskeletal strain, pneumonia, pneumothorax, PE, ACS, lower suspicion acute intra-abdominal process given reassuring abdominal exam, consideration for early shingles before onset of rash  Patient's presentation is most consistent with acute presentation with potential threat  to life or bodily function.  53 year old male presenting to the emergency department for evaluation of left-sided chest wall pain.  Stable vitals on presentation.  Does have some reproducible tenderness on exam but no recent trauma and does report some pleuritic pain.  Will obtain labs, EKG, rib series x-Ruhan Borak to further evaluate.  Clinical Course as of 01/16/24 0932  Fri Jan 16, 2024  0923 Labs reassuring including CBC, CMP.  Normal lipase and D-dimer.  Troponin within normal limits and well over 3 hours of symptoms.  Low risk heart score.  Chest x-Lamona Eimer without focal consolidation or fracture.  Patient reassessed.  Reports improved symptoms.  No new complaints.  He is comfortable with discharge home which I do think is reasonable given his reassuring workup.  Will DC with short course of anti-inflammatories, discussed other supportive care measures including over-the-counter lidocaine  patches.  Strict return precautions were provided.  Patient was discharged in stable condition. [NR]    Clinical Course User Index [NR] Levander Slate, MD     FINAL CLINICAL IMPRESSION(S) / ED DIAGNOSES   Final diagnoses:  Left-sided chest wall pain     Rx / DC Orders   ED Discharge Orders          Ordered    naproxen  (NAPROSYN ) 500 MG tablet  2 times daily with meals        01/16/24 0932             Note:  This document was prepared using Dragon voice recognition software and may include unintentional dictation errors.   Levander Slate, MD 01/16/24 [phone]

## 2024-01-16 NOTE — ED Triage Notes (Signed)
 Pt comes with left lower pain that started yesterday. Pt state mild discomfort. Pt states diarrhea. Pt denies any N/V

## 2024-02-27 ENCOUNTER — Other Ambulatory Visit: Payer: Self-pay

## 2024-03-01 ENCOUNTER — Other Ambulatory Visit: Payer: Self-pay

## 2024-03-01 MED ORDER — SILDENAFIL CITRATE 100 MG PO TABS
100.0000 mg | ORAL_TABLET | Freq: Every day | ORAL | 0 refills | Status: AC | PRN
Start: 2024-03-01 — End: ?
  Filled 2024-03-01: qty 90, 90d supply, fill #0

## 2024-03-02 ENCOUNTER — Other Ambulatory Visit: Payer: Self-pay

## 2024-03-03 ENCOUNTER — Other Ambulatory Visit: Payer: Self-pay

## 2024-05-03 NOTE — Progress Notes (Signed)
 Chief Complaint: Chief Complaint  Patient presents with   Follow-up    medications   Nasal Congestion    X 3 days    Subjective: Tracy Herring is a 53 y.o. male in today for: Follow-up Patient comes in today for follow-up.  Recently had inquired about Truvada or possibly twice a year shot for HIV prevention.  Was not comfortable with this as I am not as familiar with these medications and referred him to ID.  He has an appointment next week.  Patient does note that for about 3 days now he has had nasal congestion and a cough.  No fevers.  No production.    Past Medical History:  Diagnosis Date   Anxiety    Chicken pox    GERD (gastroesophageal reflux disease)    Hemorrhoids    Hyperlipidemia 08/12/2022   Supraventricular tachycardia (HHS-HCC)    Past Surgical History:  Procedure Laterality Date   UPPER GASTROINTESTINAL ENDOSCOPY  10/06/2008   Dr. Lamar Herring   ABLATION ARRYTHMIA FOCUS     APPENDECTOMY     Family History  Problem Relation Name Age of Onset   No Known Problems Mother     High blood pressure (Hypertension) Father     Diabetes Father     Stroke Father  49   Breast cancer Sister  104       d   No Known Problems Sister     No Known Problems Brother     No Known Problems Brother     Cancer Maternal Grandmother     Diabetes type II Maternal Grandfather     Alcohol abuse Paternal Grandfather     Current Outpatient Medications on File Prior to Visit  Medication Sig Dispense Refill   ascorbic acid, vitamin C, (VITAMIN C) 500 MG tablet Take 1,000 mg by mouth once daily     maca extract 500 mg Cap Take 1 capsule by mouth once daily     magnesium 250 mg Tab Take 2 tablets by mouth once daily     potassium bitartrate, bulk, Powd Use once daily     sildenafiL  (VIAGRA ) 100 MG tablet Take 1 tablet (100 mg total) by mouth daily as needed for Erectile Dysfunction 90 tablet 0   No current facility-administered medications on file prior to  visit.    Review of Systems - 10 systems negative except as in HPI Objective: Vitals:   05/03/24 1539  BP: 124/76  Pulse: 78  Temp: 36.8 C (98.3 F)   Body mass index is 27.12 kg/m.  In general he is in no acute distress, alert and oriented x 3 HEENT he has no sinus tenderness, neck is supple, no lymphadenopathy, does have some cerumen impaction but not blocking Lungs are clear to auscultation bilaterally Heart is regular rate and rhythm without murmurs rubs or gallops Extremities without cyanosis clubbing or edema Assessment & Plan: Diagnoses and all orders for this visit:  Acute cough -     Extended Respiratory Viral Panel Tracy Herring; Future Extended respiratory panel today Nasal congestion -     Extended Respiratory Viral Panel - Tracy Herring; Future  Rhinorrhea -     Extended Respiratory Viral Panel - Tracy Herring; Future  Ear itching Have him try to get the earwax taken care of with drops and flushing, let us  know if not successful, if ears continue to itch, try 50-50 mix of white vinegar and water, few drops every day for a couple of weeks

## 2024-05-10 ENCOUNTER — Other Ambulatory Visit: Payer: Self-pay

## 2024-05-10 MED ORDER — NA SULFATE-K SULFATE-MG SULF 17.5-3.13-1.6 GM/177ML PO SOLN
ORAL | 0 refills | Status: DC
  Filled 2024-05-10 – 2024-05-28 (×2): qty 354, 1d supply, fill #0

## 2024-05-11 ENCOUNTER — Other Ambulatory Visit
Admission: RE | Admit: 2024-05-11 | Discharge: 2024-05-11 | Disposition: A | Discharge status: Home / Self Care | Source: Ambulatory Visit | Attending: Infectious Diseases | Admitting: Infectious Diseases

## 2024-05-11 ENCOUNTER — Encounter: Payer: Self-pay | Admitting: Infectious Diseases

## 2024-05-11 ENCOUNTER — Other Ambulatory Visit (HOSPITAL_COMMUNITY): Payer: Self-pay

## 2024-05-11 ENCOUNTER — Ambulatory Visit: Attending: Infectious Diseases | Admitting: Infectious Diseases

## 2024-05-11 ENCOUNTER — Telehealth: Payer: Self-pay

## 2024-05-11 VITALS — BP 103/67 | HR 86 | Temp 97.3°F | Ht 70.0 in | Wt 192.0 lb

## 2024-05-11 DIAGNOSIS — Z9189 Other specified personal risk factors, not elsewhere classified: Secondary | ICD-10-CM | POA: Insufficient documentation

## 2024-05-11 DIAGNOSIS — Z7252 High risk homosexual behavior: Secondary | ICD-10-CM | POA: Diagnosis not present

## 2024-05-11 DIAGNOSIS — Z79624 Long term (current) use of inhibitors of nucleotide synthesis: Secondary | ICD-10-CM | POA: Diagnosis not present

## 2024-05-11 DIAGNOSIS — Z792 Long term (current) use of antibiotics: Secondary | ICD-10-CM | POA: Diagnosis not present

## 2024-05-11 DIAGNOSIS — Z2981 Encounter for HIV pre-exposure prophylaxis: Secondary | ICD-10-CM | POA: Insufficient documentation

## 2024-05-11 LAB — CBC WITH DIFFERENTIAL/PLATELET
Abs Immature Granulocytes: 0.01 K/uL (ref 0.00–0.07)
Basophils Absolute: 0.1 K/uL (ref 0.0–0.1)
Basophils Relative: 1 %
Eosinophils Absolute: 0.4 K/uL (ref 0.0–0.5)
Eosinophils Relative: 8 %
HCT: 39 % (ref 39.0–52.0)
Hemoglobin: 13.6 g/dL (ref 13.0–17.0)
Immature Granulocytes: 0 %
Lymphocytes Relative: 56 %
Lymphs Abs: 2.8 K/uL (ref 0.7–4.0)
MCH: 32.2 pg (ref 26.0–34.0)
MCHC: 34.9 g/dL (ref 30.0–36.0)
MCV: 92.4 fL (ref 80.0–100.0)
Monocytes Absolute: 0.3 K/uL (ref 0.1–1.0)
Monocytes Relative: 7 %
Neutro Abs: 1.4 K/uL — ABNORMAL LOW (ref 1.7–7.7)
Neutrophils Relative %: 28 %
Platelets: 251 K/uL (ref 150–400)
RBC: 4.22 MIL/uL (ref 4.22–5.81)
RDW: 12.2 % (ref 11.5–15.5)
WBC: 5 K/uL (ref 4.0–10.5)
nRBC: 0 % (ref 0.0–0.2)

## 2024-05-11 LAB — COMPREHENSIVE METABOLIC PANEL WITH GFR
ALT: 24 U/L (ref 0–44)
AST: 28 U/L (ref 15–41)
Albumin: 4.3 g/dL (ref 3.5–5.0)
Alkaline Phosphatase: 85 U/L (ref 38–126)
Anion gap: 11 (ref 5–15)
BUN: 15 mg/dL (ref 6–20)
CO2: 24 mmol/L (ref 22–32)
Calcium: 9.1 mg/dL (ref 8.9–10.3)
Chloride: 105 mmol/L (ref 98–111)
Creatinine, Ser: 1.04 mg/dL (ref 0.61–1.24)
GFR, Estimated: >60 mL/min
Glucose, Bld: 101 mg/dL — ABNORMAL HIGH (ref 70–99)
Potassium: 4 mmol/L (ref 3.5–5.1)
Sodium: 140 mmol/L (ref 135–145)
Total Bilirubin: 0.2 mg/dL (ref 0.0–1.2)
Total Protein: 7.4 g/dL (ref 6.5–8.1)

## 2024-05-11 LAB — CHLAMYDIA/NGC RT PCR (ARMC ONLY)
Chlamydia Tr: NOT DETECTED
Chlamydia Tr: NOT DETECTED
Chlamydia Tr: NOT DETECTED
N gonorrhoeae: NOT DETECTED
N gonorrhoeae: NOT DETECTED
N gonorrhoeae: NOT DETECTED

## 2024-05-11 LAB — HIV ANTIBODY (ROUTINE TESTING W REFLEX): HIV Screen 4th Generation wRfx: NONREACTIVE

## 2024-05-11 NOTE — Progress Notes (Signed)
 ID PREP Visit NAME: Tracy Herring  DOB: 07-03-1970  MRN: [id number]  Date/Time: 05/11/2024 10:21 AM Subjective:  Referred by Dr.Hedrick PT agreed to the use of AI scribe PreP visit Tracy Herring is a 53 y.o. with a history of Afib s/p cardiac ablation now in SR  presents for consultation regarding pre-exposure prophylaxis (PrEP) for HIV.  He is currently taking Descovy for PrEP, which he started nine days ago after seeing an advertisement on Facebook. He obtained the medication through a online company [employer] that provides it for free he says ( but his insurance is charged as per the website), along with a home testing kit for kidney function and STDs, including HIV. He has not brought any recent test results to the appointment.  He has a history of engaging in sexual activity with men and has had three partners in the last month. He does not consistently use condoms. He was tested for HIV a month ago and for STDs at the health department in June of this year. He has never been diagnosed with syphilis, gonorrhea, chlamydia, or herpes.  He takes doxycycline after sexual encounters, typically two hours later, as provided by the same company. This medication is also provided for free by the same company.  He reports occasional palpitations, particularly after eating, but previous evaluations, including an 11-day Holter monitor, have not identified any abnormalities. He has a history of heart ablation for atrial fibrillation performed in 2010 at [facility].  He lives with his partner in an open relationship. His partner is not HIV positive and does not take PrEP due to degenerative osteoarthritis. 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  No history of high blood pressure, diabetes, kidney issues, or allergies. He does not smoke, drink alcohol, or use drugs.   Was referred to me by his PCP- patient  wants to know whether he can get the  long acting injection Lenacapravir  Past Surgical History:  Procedure Laterality Date   ABLATION     CHOLECYSTECTOMY  0630/2014    Social History   Socioeconomic History   Marital status: Single    Spouse name: Not on file   Number of children: Not on file   Years of education: Not on file   Highest education level: Not on file  Occupational History   Not on file  Tobacco Use   Smoking status: Never   Smokeless tobacco: Never  Vaping Use   Vaping status: Never Used  Substance and Sexual Activity   Alcohol use: Never    Alcohol/week: 0.0 standard drinks of alcohol   Drug use: Never   Sexual activity: Yes    Partners: Male    Birth control/protection: Condom    Comment: condoms some  Other Topics Concern   Not on file  Social History Narrative   Not on file   Social Drivers of Health   Tobacco Use: Low Risk (05/11/2024)   Patient History    Smoking Tobacco Use: Never    Smokeless Tobacco Use: Never    Passive Exposure: Not on file  Financial Resource Strain: Patient Declined (08/12/2022)   Received from [facility]   Overall Financial Resource Strain (CARDIA)    Difficulty of Paying Living Expenses: Patient declined  Food Insecurity: Patient Declined (08/12/2022)   Received from [facility]   Epic    Within the past 12 months, you worried that your food would run  out before you got the money to buy more.: Patient declined    Within the past 12 months, the food you bought just didn't last and you didn't have money to get more.: Patient declined  Transportation Needs: Patient Declined (08/12/2022)   Received from [facility] - Transportation    In the past 12 months, has lack of transportation kept you from medical appointments or from getting medications?: Patient declined    Lack of Transportation (Non-Medical): Patient declined  Physical Activity: Not on file  Stress: Not on file  Social Connections:  Not on file  Intimate Partner Violence: Not on file  Depression (EYV7-0): Not on file  Alcohol Screen: Not on file  Housing: Unknown (11/06/2023)   Received from [facility]   Epic    In the last 12 months, was there a time when you were not able to pay the mortgage or rent on time?: Patient declined    Number of Times Moved in the Last Year: Not on file    At any time in the past 12 months, were you homeless or living in a shelter (including now)?: No  Utilities: Patient Declined (08/12/2022)   Received from [facility] Utilities    Threatened with loss of utilities: Patient declined  Health Literacy: Not on file    No family history on file. Allergies[1] ? Current Outpatient Medications  Medication Sig Dispense Refill   DESCOVY 200-25 MG tablet Take 1 tablet by mouth daily.     doxycycline (VIBRA-TABS) 100 MG tablet SMARTSIG:2 Tablet(s) By Mouth     sildenafil  (VIAGRA ) 100 MG tablet Take 1 tablet (100 mg total) by mouth daily as needed for Erectile Dysfunction 90 tablet 0   sildenafil  (VIAGRA ) 100 MG tablet Take 1 tablet (100 mg total) by mouth daily as needed for Erectile Dysfunction 90 tablet 0   Na Sulfate-K Sulfate-Mg Sulfate concentrate (SUPREP) 17.5-3.13-1.6 GM/177ML SOLN Take 1 Bottle by mouth as directed One kit contains 2 bottles.  Take both bottles at the times instructed by your provider. 354 mL 0   No current facility-administered medications for this visit.    REVIEW OF SYSTEMS:  Const: negative fever, negative chills, negative weight loss Eyes: negative diplopia or visual changes, negative eye pain ENT: negative coryza, negative sore throat Resp: negative cough, hemoptysis, dyspnea Cards: negative for chest pain, palpitations, lower extremity edema GU: negative for frequency, dysuria and hematuria Skin: negative for rash and pruritus Heme: negative for easy bruising and gum/nose bleeding MS: negative for myalgias,  arthralgias, back pain and muscle weakness Neurolo:negative for headaches, dizziness, vertigo, memory problems  Psych: negative for feelings of anxiety, depression   ? Objective:  VITALS:  BP 103/67   Pulse 86   Temp (!) 97.3 F (36.3 C) (Temporal)   Ht 5' 10 (1.778 m)   Wt 192 lb (87.1 kg)   SpO2 95%   BMI 27.55 kg/m  PHYSICAL EXAM:  General: Alert, cooperative, no distress, appears stated age.  Head: Normocephalic, without obvious abnormality, atraumatic. Eyes: Conjunctivae clear, anicteric sclerae. Pupils are equal Nose: Nares normal. No drainage or sinus tenderness. Throat: Lips, mucosa, and tongue normal. No Thrush Neck: Supple, symmetrical, no adenopathy, thyroid: non tender no carotid bruit and no JVD. Back: No CVA tenderness. Lungs: Clear to auscultation bilaterally. No Wheezing or Rhonchi. No rales. Heart: Regular rate and rhythm, no murmur, rub or gallop. Abdomen: Soft, non-tender,not distended. Bowel sounds normal. No  masses Extremities: Extremities normal, atraumatic, no cyanosis. No edema. No clubbing Skin: No rashes or lesions. Not Jaundiced Lymph: Cervical, supraclavicular normal. Neurologic: Grossly non-focal Pertinent Labs  HIV CMP HBV serology ( HBsag, S ab , core antibody) HAV HCV RPR/ GC/CHL UA 30 day follow up Side effects Serum creatinine ( for > 65 yr, borderline creatinine, HTN, DM, AA Risk reduction 3 month follow up  HIV/GC/CHL/CMP  Impression/Recommendation Pre exposure prophylaxis for HIV - on Descovy- getting it from online source [employer]  Interested in switching to a biannual injection due to concerns about daily medication adherence and potential side effects such as kidney issues and bone density loss. No known HIV exposure from partners. Discussed the convenience of biannual injections and insurance coverage.  - Ordered routine blood test to check for HIV and other STDs. - Will check insurance eligibility for biannual HIV  prophylaxis injections. - Advised to stop Descovy once biannual injections are initiated and inform the current provider to avoid duplicate billing.  Bacterial sexually transmitted infection post-exposure prophylaxis management Engages in sexual activities with multiple partners and does not always use condoms. Last STD screening was in June 2025, which was negative. Discussed the importance of using condoms to reduce STD risk. - Performed rectal swab for STD screening. - Advised on the importance of using condoms to reduce STD risk.  Discussed the management with patient in detail IF Lenacapavir is approved then he will have to go to [facility] ?     [1] No Known Allergies

## 2024-05-11 NOTE — Patient Instructions (Signed)
 Today, you came in to discuss your current use of pre-exposure prophylaxis (PrEP) for HIV and to explore other options. You are currently taking Descovy for PrEP and are interested in switching to a biannual injection. We also discussed your sexual health and the importance of regular STD screenings and condom use.  YOUR PLAN:  -HIV PRE-EXPOSURE PROPHYLAXIS MANAGEMENT: HIV pre-exposure prophylaxis (PrEP) is a preventive treatment for people who do not have HIV but are at high risk of getting it. You are currently taking Descovy for PrEP and are interested in switching to a biannual injection due to concerns about daily medication adherence and potential side effects. We will check your insurance eligibility for the biannual injections and have ordered routine blood tests to check for HIV and other STDs. Once you start the biannual injections, you should stop taking Descovy and inform your current provider to avoid duplicate billing.  -BACTERIAL SEXUALLY TRANSMITTED INFECTION POST-EXPOSURE PROPHYLAXIS MANAGEMENT: Post-exposure prophylaxis for bacterial sexually transmitted infections (STIs) involves taking Doxycycline  after unprotected sex  to reduce the risk of infection. You engage in sexual activities with multiple partners and do not always use condoms. We performed a rectal swab, oral swab and urine test for STD screening and discussed the importance of using condoms to reduce your risk of STDs.  INSTRUCTIONS:  We have ordered routine blood tests to check for HIV and other STDs. We will also check your insurance eligibility for biannual HIV prophylaxis injections. Once you start the biannual injections, please stop taking Descovy and inform your current provider to avoid duplicate billing.

## 2024-05-12 LAB — SYPHILIS: RPR W/REFLEX TO RPR TITER AND TREPONEMAL ANTIBODIES, TRADITIONAL SCREENING AND DIAGNOSIS ALGORITHM: RPR Ser Ql: NONREACTIVE

## 2024-05-12 NOTE — Telephone Encounter (Signed)
 Pharmacy Patient Advocate Encounter   Received notification from [employer] that prior authorization for YEZTUGO INJ  is required/requested.   Insurance verification completed.   The patient is insured through [facility].   Per test claim: PA required; PA submitted to above mentioned insurance via [employer] Key/confirmation #/EOC [facility] Status is pending

## 2024-05-14 NOTE — Telephone Encounter (Signed)
 Pharmacy Patient Advocate Encounter  Received notification from [employer] that Prior Authorization for [facility] INJ has been DENIED.  Full denial letter will be uploaded to the media tab. See denial reason below.  Patient must try all three and fail before trying [facility]   PA #/Case ID/Reference #: [id number]

## 2024-05-14 NOTE — Telephone Encounter (Signed)
 Should we have him try Apretude first?

## 2024-05-18 ENCOUNTER — Other Ambulatory Visit (HOSPITAL_COMMUNITY): Payer: Self-pay

## 2024-05-21 NOTE — Telephone Encounter (Signed)
 Attempted to reach patient to discuss Apretude. No answer.  Sheela Mcculley ONEIDA Ligas, CMA

## 2024-05-26 ENCOUNTER — Other Ambulatory Visit: Payer: Self-pay

## 2024-05-28 ENCOUNTER — Other Ambulatory Visit: Payer: Self-pay

## 2024-05-31 ENCOUNTER — Encounter
Admission: RE | Disposition: A | Discharge status: Home / Self Care | Payer: Self-pay | Source: Home / Self Care | Attending: Gastroenterology

## 2024-05-31 ENCOUNTER — Other Ambulatory Visit: Payer: Self-pay

## 2024-05-31 ENCOUNTER — Encounter: Payer: Self-pay | Admitting: Anesthesiology

## 2024-05-31 ENCOUNTER — Ambulatory Visit
Admission: RE | Admit: 2024-05-31 | Discharge: 2024-05-31 | Disposition: A | Discharge status: Home / Self Care | Attending: Gastroenterology | Admitting: Gastroenterology

## 2024-05-31 ENCOUNTER — Encounter: Payer: Self-pay | Admitting: Gastroenterology

## 2024-05-31 DIAGNOSIS — Z539 Procedure and treatment not carried out, unspecified reason: Secondary | ICD-10-CM | POA: Diagnosis not present

## 2024-05-31 DIAGNOSIS — Z1211 Encounter for screening for malignant neoplasm of colon: Secondary | ICD-10-CM | POA: Insufficient documentation

## 2024-05-31 HISTORY — PX: COLONOSCOPY: SHX5424

## 2024-05-31 MED ORDER — NA SULFATE-K SULFATE-MG SULF 17.5-3.13-1.6 GM/177ML PO SOLN
ORAL | 0 refills | Status: AC
  Filled 2024-05-31: qty 354, 1d supply, fill #0

## 2024-05-31 MED ORDER — SODIUM CHLORIDE 0.9 % IV SOLN: INTRAVENOUS | Status: DC

## 2024-05-31 NOTE — OR Nursing (Signed)
 Patient not clean still having brown stools, more prep will be ordered and patient will be rescheduled for tomorrow

## 2024-06-01 ENCOUNTER — Encounter: Admission: RE | Disposition: A | Payer: Self-pay | Source: Home / Self Care | Attending: Gastroenterology

## 2024-06-01 ENCOUNTER — Ambulatory Visit: Admitting: Anesthesiology

## 2024-06-01 ENCOUNTER — Ambulatory Visit
Admission: RE | Admit: 2024-06-01 | Discharge: 2024-06-01 | Disposition: A | Attending: Gastroenterology | Admitting: Gastroenterology

## 2024-06-01 ENCOUNTER — Other Ambulatory Visit (HOSPITAL_COMMUNITY): Payer: Self-pay

## 2024-06-01 ENCOUNTER — Encounter: Payer: Self-pay | Admitting: Gastroenterology

## 2024-06-01 DIAGNOSIS — K640 First degree hemorrhoids: Secondary | ICD-10-CM | POA: Insufficient documentation

## 2024-06-01 DIAGNOSIS — Z1211 Encounter for screening for malignant neoplasm of colon: Secondary | ICD-10-CM | POA: Insufficient documentation

## 2024-06-01 HISTORY — PX: COLONOSCOPY: SHX5424

## 2024-06-01 HISTORY — DX: Supraventricular tachycardia, unspecified: I47.10

## 2024-06-01 MED ORDER — SODIUM CHLORIDE 0.9 % IV SOLN: INTRAVENOUS | Status: DC

## 2024-06-01 MED ORDER — LIDOCAINE HCL (CARDIAC) PF 100 MG/5ML IV SOSY
PREFILLED_SYRINGE | INTRAVENOUS | Status: DC | PRN
  Administered 2024-06-01: 40 mg via INTRAVENOUS

## 2024-06-01 MED ORDER — PROPOFOL 500 MG/50ML IV EMUL
INTRAVENOUS | Status: DC | PRN
  Administered 2024-06-01: 150 ug/kg/min via INTRAVENOUS

## 2024-06-01 MED ORDER — PROPOFOL 10 MG/ML IV BOLUS
INTRAVENOUS | Status: DC | PRN
  Administered 2024-06-01: 80 mg via INTRAVENOUS

## 2024-06-01 NOTE — Anesthesia Postprocedure Evaluation (Signed)
"   Anesthesia Post Note  Patient: Tracy Herring  Procedure(s) Performed: COLONOSCOPY  Patient location during evaluation: PACU Anesthesia Type: General Level of consciousness: awake and alert Pain management: pain level controlled Vital Signs Assessment: post-procedure vital signs reviewed and stable Respiratory status: spontaneous breathing, nonlabored ventilation, respiratory function stable and patient connected to nasal cannula oxygen Cardiovascular status: blood pressure returned to baseline and stable Postop Assessment: no apparent nausea or vomiting Anesthetic complications: no   No notable events documented.   Last Vitals:  Vitals:   06/01/24 0819 06/01/24 0827  BP: 108/73 112/79  Pulse: 65 78  Resp: 16 16  Temp:    SpO2: 100%     Last Pain:  Vitals:   06/01/24 0819  TempSrc:   PainSc: 0-No pain                 [name]      "

## 2024-06-01 NOTE — Telephone Encounter (Signed)
 Patient called back regarding voicemail from earlier this month. Relayed that [name] is not covered by his insurance, he is interested in Apretude.   [name], BSN, RN

## 2024-06-01 NOTE — Op Note (Signed)
 [facility] Patient Name: Tracy Herring Procedure Date: 06/01/2024 7:29 AM MRN: [id number] Account #: [id number] Date of Birth: December 18, 1970 Admit Type: Outpatient Age: 54 Room: [facility] ENDO ROOM 2 Gender: Male Note Status: Finalized Instrument Name: Colon Scope [id number] Procedure:             Colonoscopy Indications:           Screening for colorectal malignant neoplasm Providers:             Ruel Kung MD, MD Referring MD:          Lynwood FALCON. Valora, MD (Referring MD) Medicines:             Monitored Anesthesia Care Complications:         No immediate complications. Procedure:             Pre-Anesthesia Assessment:                        - Prior to the procedure, a History and Physical was                         performed, and patient medications, allergies and                         sensitivities were reviewed. The patient's tolerance                         of previous anesthesia was reviewed.                        - The risks and benefits of the procedure and the                         sedation options and risks were discussed with the                         patient. All questions were answered and informed                         consent was obtained.                        - ASA Grade Assessment: II - A patient with mild                         systemic disease.                        After obtaining informed consent, the colonoscope was                         passed under direct vision. Throughout the procedure,                         the patient's blood pressure, pulse, and oxygen                         saturations were monitored continuously. The  Colonoscope was introduced through the anus and                         advanced to the the cecum, identified by the                         appendiceal orifice. The colonoscopy was performed                         with ease. The patient tolerated the  procedure well.                         The quality of the bowel preparation was excellent.                         The ileocecal valve, appendiceal orifice, and rectum                         were photographed. Findings:      Non-bleeding internal hemorrhoids were found during retroflexion. The       hemorrhoids were large and Grade I (internal hemorrhoids that do not       prolapse).      The exam was otherwise without abnormality on direct and retroflexion       views. Impression:            - Non-bleeding internal hemorrhoids.                        - The examination was otherwise normal on direct and                         retroflexion views.                        - No specimens collected. Recommendation:        - Discharge patient to home (with escort).                        - Resume previous diet.                        - Continue present medications.                        - Repeat colonoscopy in 10 years for screening                         purposes. Procedure Code(s):     --- Professional ---                        [id number], Colonoscopy, flexible; diagnostic, including                         collection of specimen(s) by brushing or washing, when                         performed (separate procedure) Diagnosis Code(s):     --- Professional ---  Z12.11, Encounter for screening for malignant neoplasm                         of colon                        K64.0, First degree hemorrhoids CPT copyright 2022 American [employer]. All rights reserved. The codes documented in this report are preliminary and upon coder review may  be revised to meet current compliance requirements. Ruel Kung, MD Ruel Kung MD, MD 06/01/2024 7:55:51 AM This report has been signed electronically. Number of Addenda: 0 Note Initiated On: 06/01/2024 7:29 AM Scope Withdrawal Time: 0 hours 8 minutes 16 seconds  Total Procedure Duration: 0 hours 10 minutes 32 seconds   Estimated Blood Loss:  Estimated blood loss: none.      [facility]

## 2024-06-01 NOTE — Transfer of Care (Signed)
 Immediate Anesthesia Transfer of Care Note  Patient: Tracy Herring  Procedure(s) Performed: COLONOSCOPY  Patient Location: Endoscopy Unit  Anesthesia Type:General  Level of Consciousness: drowsy  Airway & Oxygen Therapy: Patient Spontanous Breathing  Post-op Assessment: Report given to RN and Post -op Vital signs reviewed and stable  Post vital signs: Reviewed and stable  Last Vitals:  Vitals Value Taken Time  BP 87/61 06/01/24 07:58  Temp    Pulse 85 06/01/24 07:59  Resp 11 06/01/24 07:59  SpO2 98 % 06/01/24 07:59  Vitals shown include unfiled device data.  Last Pain:  Vitals:   06/01/24 0713  TempSrc: Temporal  PainSc: 0-No pain         Complications: No notable events documented.

## 2024-06-01 NOTE — H&P (Signed)
 "                                                                                                                           Ruel Kung , MD [address], [place], [place], [address] Phone: [phone] Fax: [phone]  Primary Care Physician:  Valora Lynwood FALCON, MD   Pre-Procedure History & Physical: HPI:  Tracy Herring is a 54 y.o. male is here for an colonoscopy.   Past Medical History:  Diagnosis Date   SVT (supraventricular tachycardia)     Past Surgical History:  Procedure Laterality Date   ABLATION     CHOLECYSTECTOMY  0630/2014   COLONOSCOPY N/A 05/31/2024   Procedure: COLONOSCOPY;  Surgeon: Kung Ruel, MD;  Location: [facility] ENDOSCOPY;  Service: Gastroenterology;  Laterality: N/A;  SPANISH INTERPRETER    Prior to Admission medications  Medication Sig Start Date End Date Taking? Authorizing Provider  magnesium 30 MG tablet Take 250 mg by mouth 2 (two) times daily.   Yes [provider]  DESCOVY 200-25 MG tablet Take 1 tablet by mouth daily. 04/19/24   [provider]  doxycycline (VIBRA-TABS) 100 MG tablet SMARTSIG:2 Tablet(s) By Mouth 04/19/24   [provider]  Na Sulfate-K Sulfate-Mg Sulfate concentrate (SUPREP) 17.5-3.13-1.6 GM/177ML SOLN One kit contains 2 bottles - Take both bottles at the times instructed by your provider. 05/31/24     sildenafil  (VIAGRA ) 100 MG tablet Take 1 tablet (100 mg total) by mouth daily as needed for Erectile Dysfunction 09/17/22     sildenafil  (VIAGRA ) 100 MG tablet Take 1 tablet (100 mg total) by mouth daily as needed for Erectile Dysfunction 03/01/24       Allergies as of 05/31/2024   (No Known Allergies)    No family history on file.  Social History   Socioeconomic History   Marital status: Single    Spouse name: Not on file   Number of children: Not on file   Years of education: Not on file   Highest education level: Not on file  Occupational History   Not on file  Tobacco Use    Smoking status: Never   Smokeless tobacco: Never  Vaping Use   Vaping status: Never Used  Substance and Sexual Activity   Alcohol use: Never    Alcohol/week: 0.0 standard drinks of alcohol   Drug use: Never   Sexual activity: Yes    Partners: Male    Birth control/protection: Condom    Comment: condoms some  Other Topics Concern   Not on file  Social History Narrative   Not on file   Social Drivers of Health   Tobacco Use: Low Risk (05/11/2024)   Patient History    Smoking Tobacco Use: Never    Smokeless Tobacco Use: Never    Passive Exposure: Not on file  Financial Resource Strain: Low Risk  (05/10/2024)   Received from [facility]   Overall Financial Resource Strain (  CARDIA)    Difficulty of Paying Living Expenses: Not hard at all  Food Insecurity: No Food Insecurity (05/10/2024)   Received from [facility]   Epic    Within the past 12 months, you worried that your food would run out before you got the money to buy more.: Never true    Within the past 12 months, the food you bought just didn't last and you didn't have money to get more.: Never true  Transportation Needs: No Transportation Needs (05/10/2024)   Received from [facility] - Transportation    In the past 12 months, has lack of transportation kept you from medical appointments or from getting medications?: No    Lack of Transportation (Non-Medical): No  Physical Activity: Not on file  Stress: Not on file  Social Connections: Not on file  Intimate Partner Violence: Not on file  Depression (EYV7-0): Not on file  Alcohol Screen: Not on file  Housing: Low Risk  (05/10/2024)   Received from [facility]   Epic    In the last 12 months, was there a time when you were not able to pay the mortgage or rent on time?: No    In the past 12 months, how many times have you moved where you were living?: 0    At any time in the past 12  months, were you homeless or living in a shelter (including now)?: No  Utilities: Not At Risk (05/10/2024)   Received from [facility]   Epic    In the past 12 months has the electric, gas, oil, or water company threatened to shut off services in your home?: No  Health Literacy: Not on file    Review of Systems: See HPI, otherwise negative ROS  Physical Exam: BP 126/82   Pulse 77   Temp (!) 96 F (35.6 C) (Temporal)   Resp 18   Ht 5' 10 (1.778 m)   SpO2 100%   BMI 27.55 kg/m  General:   Alert,  pleasant and cooperative in NAD Head:  Normocephalic and atraumatic. Neck:  Supple; no masses or thyromegaly. Lungs:  Clear throughout to auscultation, normal respiratory effort.    Heart:  +S1, +S2, Regular rate and rhythm, No edema. Abdomen:  Soft, nontender and nondistended. Normal bowel sounds, without guarding, and without rebound.   Neurologic:  Alert and  oriented x4;  grossly normal neurologically.  Impression/Plan: Tracy Herring is here for an colonoscopy to be performed for Screening colonoscopy average risk   Risks, benefits, limitations, and alternatives regarding  colonoscopy have been reviewed with the patient.  Questions have been answered.  All parties agreeable.   Ruel Kung, MD  06/01/2024, 7:30 AM  "

## 2024-06-01 NOTE — Anesthesia Preprocedure Evaluation (Signed)
"                                    Anesthesia Evaluation  Patient identified by MRN, date of birth, ID band Patient awake    Reviewed: Allergy & Precautions, H&P , NPO status , Patient's Chart, lab work & pertinent test results, reviewed documented beta blocker date and time   Airway Mallampati: II   Neck ROM: full    Dental  (+) Poor Dentition   Pulmonary neg pulmonary ROS   Pulmonary exam normal        Cardiovascular Exercise Tolerance: Good negative cardio ROS Normal cardiovascular exam Rhythm:regular Rate:Normal     Neuro/Psych negative neurological ROS  negative psych ROS   GI/Hepatic negative GI ROS, Neg liver ROS,,,  Endo/Other  negative endocrine ROS    Renal/GU negative Renal ROS  negative genitourinary   Musculoskeletal   Abdominal   Peds  Hematology negative hematology ROS (+)   Anesthesia Other Findings Past Medical History: No date: SVT (supraventricular tachycardia) Past Surgical History: No date: ABLATION 0630/2014: CHOLECYSTECTOMY 05/31/2024: COLONOSCOPY; N/A     Comment:  Procedure: COLONOSCOPY;  Surgeon: Therisa Bi, MD;                Location: [facility] ENDOSCOPY;  Service: Gastroenterology;                Laterality: N/A;  SPANISH INTERPRETER BMI    Body Mass Index: 27.55 kg/m     Reproductive/Obstetrics negative OB ROS                              Anesthesia Physical Anesthesia Plan  ASA: 2  Anesthesia Plan: General   Post-op Pain Management:    Induction:   PONV Risk Score and Plan:   Airway Management Planned:   Additional Equipment:   Intra-op Plan:   Post-operative Plan:   Informed Consent: I have reviewed the patients History and Physical, chart, labs and discussed the procedure including the risks, benefits and alternatives for the proposed anesthesia with the patient or authorized representative who has indicated his/her understanding and acceptance.     Dental Advisory  Given  Plan Discussed with: CRNA  Anesthesia Plan Comments:         Anesthesia Quick Evaluation  "

## 2024-06-01 NOTE — Telephone Encounter (Signed)
 Spoke with Arland about inability to coordinate [facility] deliveries to inpatient pharmacy as we currently do with [facility] patients. If patient would like to switch to [facility], he would need to come visit at [facility].

## 2024-06-02 NOTE — Telephone Encounter (Signed)
 Spoke to the patient and he is aware we can give him the injections at [facility], but he will need to answer the calls from [facility] to have the medication delivered.

## 2024-06-08 ENCOUNTER — Other Ambulatory Visit (HOSPITAL_COMMUNITY): Payer: Self-pay

## 2024-08-10 ENCOUNTER — Ambulatory Visit: Admitting: Infectious Diseases
